# Patient Record
Sex: Female | Born: 1990 | Race: White | Hispanic: No | Marital: Single | Smoking: Former smoker
Health system: Southern US, Community
[De-identification: ages and names within clinical notes are randomized; demographics above are authoritative.]

## PROBLEM LIST (undated history)

## (undated) ENCOUNTER — Inpatient Hospital Stay (HOSPITAL_COMMUNITY): Payer: Self-pay

## (undated) DIAGNOSIS — O4420 Partial placenta previa NOS or without hemorrhage, unspecified trimester: Secondary | ICD-10-CM

## (undated) DIAGNOSIS — A549 Gonococcal infection, unspecified: Secondary | ICD-10-CM

## (undated) DIAGNOSIS — F319 Bipolar disorder, unspecified: Secondary | ICD-10-CM

## (undated) DIAGNOSIS — O98819 Other maternal infectious and parasitic diseases complicating pregnancy, unspecified trimester: Secondary | ICD-10-CM

## (undated) DIAGNOSIS — A749 Chlamydial infection, unspecified: Secondary | ICD-10-CM

## (undated) DIAGNOSIS — O26879 Cervical shortening, unspecified trimester: Secondary | ICD-10-CM

## (undated) DIAGNOSIS — E669 Obesity, unspecified: Secondary | ICD-10-CM

## (undated) DIAGNOSIS — F419 Anxiety disorder, unspecified: Secondary | ICD-10-CM

## (undated) DIAGNOSIS — O139 Gestational [pregnancy-induced] hypertension without significant proteinuria, unspecified trimester: Secondary | ICD-10-CM

## (undated) HISTORY — PX: NO PAST SURGERIES: SHX2092

---

## 2012-04-25 ENCOUNTER — Encounter (HOSPITAL_COMMUNITY): Payer: Self-pay | Admitting: Emergency Medicine

## 2012-04-25 ENCOUNTER — Emergency Department (HOSPITAL_COMMUNITY)
Admission: EM | Admit: 2012-04-25 | Discharge: 2012-04-25 | Disposition: A | Discharge status: Home / Self Care | Payer: Self-pay | Attending: Emergency Medicine | Admitting: Emergency Medicine

## 2012-04-25 DIAGNOSIS — R197 Diarrhea, unspecified: Secondary | ICD-10-CM | POA: Insufficient documentation

## 2012-04-25 DIAGNOSIS — R000 Tachycardia, unspecified: Secondary | ICD-10-CM | POA: Insufficient documentation

## 2012-04-25 DIAGNOSIS — R109 Unspecified abdominal pain: Secondary | ICD-10-CM | POA: Insufficient documentation

## 2012-04-25 DIAGNOSIS — R509 Fever, unspecified: Secondary | ICD-10-CM | POA: Insufficient documentation

## 2012-04-25 DIAGNOSIS — R112 Nausea with vomiting, unspecified: Secondary | ICD-10-CM | POA: Insufficient documentation

## 2012-04-25 DIAGNOSIS — F17200 Nicotine dependence, unspecified, uncomplicated: Secondary | ICD-10-CM | POA: Insufficient documentation

## 2012-04-25 LAB — COMPREHENSIVE METABOLIC PANEL
ALT: 15 U/L (ref 0–35)
AST: 18 U/L (ref 0–37)
Albumin: 3.8 g/dL (ref 3.5–5.2)
CO2: 22 mEq/L (ref 19–32)
Calcium: 8.9 mg/dL (ref 8.4–10.5)
Creatinine, Ser: 0.78 mg/dL (ref 0.50–1.10)
Sodium: 135 mEq/L (ref 135–145)
Total Protein: 7.2 g/dL (ref 6.0–8.3)

## 2012-04-25 LAB — URINALYSIS, ROUTINE W REFLEX MICROSCOPIC
Hgb urine dipstick: NEGATIVE
Nitrite: NEGATIVE
Protein, ur: NEGATIVE mg/dL
Specific Gravity, Urine: 1.013 (ref 1.005–1.030)
Urobilinogen, UA: 0.2 mg/dL (ref 0.0–1.0)

## 2012-04-25 LAB — DIFFERENTIAL
Basophils Relative: 0 % (ref 0–1)
Monocytes Relative: 9 % (ref 3–12)
Neutro Abs: 8.4 10*3/uL — ABNORMAL HIGH (ref 1.7–7.7)
Neutrophils Relative %: 86 % — ABNORMAL HIGH (ref 43–77)

## 2012-04-25 LAB — CBC
Hemoglobin: 13.2 g/dL (ref 12.0–15.0)
MCHC: 32.4 g/dL (ref 30.0–36.0)
Platelets: 164 10*3/uL (ref 150–400)
RBC: 4.36 MIL/uL (ref 3.87–5.11)

## 2012-04-25 LAB — RAPID URINE DRUG SCREEN, HOSP PERFORMED
Amphetamines: NOT DETECTED
Barbiturates: NOT DETECTED
Benzodiazepines: NOT DETECTED
Cocaine: NOT DETECTED
Tetrahydrocannabinol: NOT DETECTED

## 2012-04-25 LAB — PREGNANCY, URINE: Preg Test, Ur: NEGATIVE

## 2012-04-25 LAB — URINE MICROSCOPIC-ADD ON

## 2012-04-25 MED ORDER — OXYCODONE-ACETAMINOPHEN 5-325 MG PO TABS: 1.0000 | ORAL_TABLET | ORAL | Status: AC | PRN

## 2012-04-25 MED ORDER — SODIUM CHLORIDE 0.9 % IV BOLUS (SEPSIS)
1000.0000 mL | Freq: Once | INTRAVENOUS | Status: AC
  Administered 2012-04-25: 1000 mL via INTRAVENOUS

## 2012-04-25 MED ORDER — LOPERAMIDE HCL 2 MG PO CAPS: 2.0000 mg | ORAL_CAPSULE | Freq: Four times a day (QID) | ORAL | Status: AC | PRN

## 2012-04-25 MED ORDER — SODIUM CHLORIDE 0.9 % IV SOLN: 1000.0000 mL | INTRAVENOUS | Status: DC

## 2012-04-25 MED ORDER — DIPHENOXYLATE-ATROPINE 2.5-0.025 MG PO TABS
2.0000 | ORAL_TABLET | ORAL | Status: AC
  Administered 2012-04-25: 2 via ORAL
  Filled 2012-04-25: qty 2

## 2012-04-25 MED ORDER — ACETAMINOPHEN 325 MG PO TABS
650.0000 mg | ORAL_TABLET | Freq: Once | ORAL | Status: AC
  Administered 2012-04-25: 650 mg via ORAL
  Filled 2012-04-25: qty 2

## 2012-04-25 MED ORDER — ONDANSETRON HCL 4 MG/2ML IJ SOLN
4.0000 mg | Freq: Once | INTRAMUSCULAR | Status: AC
  Administered 2012-04-25: 4 mg via INTRAVENOUS
  Filled 2012-04-25: qty 2

## 2012-04-25 MED ORDER — SODIUM CHLORIDE 0.9 % IV BOLUS (SEPSIS): 1000.0000 mL | Freq: Once | INTRAVENOUS | Status: DC

## 2012-04-25 MED ORDER — PROMETHAZINE HCL 25 MG PO TABS: 25.0000 mg | ORAL_TABLET | Freq: Four times a day (QID) | ORAL | Status: DC | PRN

## 2012-04-25 MED ORDER — SODIUM CHLORIDE 0.9 % IV SOLN
1000.0000 mL | Freq: Once | INTRAVENOUS | Status: AC
  Administered 2012-04-25: 1000 mL via INTRAVENOUS

## 2012-04-25 MED ORDER — HYDROMORPHONE HCL PF 1 MG/ML IJ SOLN
1.0000 mg | Freq: Once | INTRAMUSCULAR | Status: AC
  Administered 2012-04-25: 1 mg via INTRAVENOUS
  Filled 2012-04-25: qty 1

## 2012-04-25 NOTE — ED Notes (Signed)
IV started in Right AC, blood drawn from site.

## 2012-04-25 NOTE — ED Notes (Signed)
MD at bedside. 

## 2012-04-25 NOTE — ED Notes (Signed)
Report received-airway intact-no s/s's of distress- 

## 2012-04-25 NOTE — Discharge Instructions (Signed)
Your blood work and urine tests have all been normal and show no sign of a cause of her pain. Please take the medications exactly as prescribed, return to the hospital for severe or worsening symptoms and understand that she may continue to have nausea vomiting and diarrhea for another 24-48 hours. Please drink plenty of fluids in the meantime to prevent dehydration. Please see the attached reading instructions regarding your diagnosis.  RESOURCE GUIDE  Dental Problems  Patients with Medicaid: [facility] [phone] [address]                                           [phone] [address] [employer] Phone:  [phone]                                                  Phone:  [phone]  If unable to pay or uninsured, contact:  [facility] or [facility]. to become qualified for the adult dental clinic.  Chronic Pain Problems Contact [facility]  [phone] Patients need to be referred by their primary care doctor.  Insufficient Money for Medicine Contact [employer]:  call "211" or [facility] [phone].  No Primary Care Doctor Call [facility]  [phone] Other agencies that provide inexpensive medical care    [facility]  [phone]    [facility] Internal Medicine  [phone]    [facility]  [phone]    [facility]  [phone]    [facility]  [phone]    [facility]  [phone]  Psychological Services [facility] Behavioral Health  [phone] [facility] Services  [phone] [facility] Mental Health   [phone] (emergency services [phone])  Substance Abuse Resources [facility]  [phone] [facility] [phone] [facility] [place] [phone] Floydene Flock [phone] Residential & Outpatient Substance Abuse Program  [phone]  Abuse/Neglect [facility] Child Abuse Hotline 712-177-4828 [facility] Child Abuse  Hotline 7123821033 (After Hours)  Emergency Shelter [facility] [phone]  Maternity Homes Room at the [facility] 8703076256 [facility] Services [phone]  MRSA Hotline #:   947-636-0952    [facility] Resources  [facility] [place]     [employer]                          [facility] Dept. [address] [place]                       [address]      [address]  Patrecia Pace  [facility] Phone:  [phone]                                   Phone:  [phone]                 Phone:  [place] Phone:  [place] [phone] (After Hours)

## 2012-04-25 NOTE — ED Notes (Signed)
Pt will call for ride home

## 2012-04-25 NOTE — ED Provider Notes (Signed)
History     CSN: [id number]  Arrival date & time 04/25/12  0448   First MD Initiated Contact with Patient 04/25/12 0540      Chief Complaint  Patient presents with  . Abdominal Pain  . Fever    (Consider location/radiation/quality/duration/timing/severity/associated sxs/prior treatment) HPI Comments: 21 year old female with no significant past medical history who presents with approximately one day of body aches, fevers and nausea. She states that she has had approximately 48 hours of watery diarrhea and nausea which over the last 24 hours has seemed to get worse. Now is associated with a fever as high as 103. Nothing seems to make this better or worse, she has associated sick contacts including family members with similar symptoms. She denies numbness, weakness, rash, swelling. She has chronic sore throat for several years, has not been coughing but does feel mild shortness of breath. She denies vaginal bleeding, dysuria, vaginal discharge.  Patient is a 21 y.o. female presenting with abdominal pain and fever. The history is provided by the patient.  Abdominal Pain The primary symptoms of the illness include abdominal pain and fever.  Fever Primary symptoms of the febrile illness include fever and abdominal pain.    History reviewed. No pertinent past medical history.  History reviewed. No pertinent past surgical history.  No family history on file.  History  Substance Use Topics  . Smoking status: Current Everyday Smoker -- 1.0 packs/day    Types: Cigarettes  . Smokeless tobacco: Not on file  . Alcohol Use: No    OB History    Grav Para Term Preterm Abortions TAB SAB Ect Mult Living                  Review of Systems  Constitutional: Positive for fever.  Gastrointestinal: Positive for abdominal pain.  All other systems reviewed and are negative.    Allergies  Review of patient's allergies indicates no known allergies.  Home Medications   Current Outpatient  Rx  Name Route Sig Dispense Refill  . LOPERAMIDE HCL 2 MG PO CAPS Oral Take 1 capsule (2 mg total) by mouth 4 (four) times daily as needed for diarrhea or loose stools. 12 capsule 0  . OXYCODONE-ACETAMINOPHEN 5-325 MG PO TABS Oral Take 1 tablet by mouth every 4 (four) hours as needed for pain. May take 2 tablets PO q 6 hours for severe pain - Do not take with Tylenol as this tablet already contains tylenol 15 tablet 0  . PROMETHAZINE HCL 25 MG PO TABS Oral Take 1 tablet (25 mg total) by mouth every 6 (six) hours as needed for nausea. 12 tablet 0    BP 112/53  Pulse 111  Temp(Src) 99.6 F (37.6 C) (Oral)  Resp 24  SpO2 97%  LMP 04/02/2012  Physical Exam  Nursing note and vitals reviewed. Constitutional: She appears well-developed and well-nourished. No distress.  HENT:  Head: Normocephalic and atraumatic.  Mouth/Throat: No oropharyngeal exudate.       Mucous membranes mildly dehydrated  Eyes: Conjunctivae and EOM are normal. Pupils are equal, round, and reactive to light. Right eye exhibits no discharge. Left eye exhibits no discharge. No scleral icterus.  Neck: Normal range of motion. Neck supple. No JVD present. No thyromegaly present.  Cardiovascular: Regular rhythm, normal heart sounds and intact distal pulses.  Exam reveals no gallop and no friction rub.   No murmur heard.      Tachycardia to 120  Pulmonary/Chest: Breath sounds normal. No respiratory distress. She  has no wheezes. She has no rales.       Mild tachypnea at 22 breaths per minute on my exam, lung sounds are clear  Abdominal: Soft. Bowel sounds are normal. She exhibits no distension and no mass. There is tenderness ( Non-Focal tenderness in the lower abdomen - including both right and left lower cautery is a suprapubic area.).  Musculoskeletal: Normal range of motion. She exhibits no edema and no tenderness.  Lymphadenopathy:    She has no cervical adenopathy.  Neurological: She is alert. Coordination normal.  Skin:  Skin is warm and dry. No rash noted. No erythema.  Psychiatric: She has a normal mood and affect. Her behavior is normal.    ED Course  Procedures (including critical care time)  Labs Reviewed  DIFFERENTIAL - Abnormal; Notable for the following:    Neutrophils Relative 86 (*)    Neutro Abs 8.4 (*)    Lymphocytes Relative 5 (*)    Lymphs Abs 0.4 (*)    All other components within normal limits  URINALYSIS, ROUTINE W REFLEX MICROSCOPIC - Abnormal; Notable for the following:    Leukocytes, UA SMALL (*)    All other components within normal limits  COMPREHENSIVE METABOLIC PANEL - Abnormal; Notable for the following:    Potassium 3.4 (*)    Glucose, Bld 113 (*)    All other components within normal limits  URINE MICROSCOPIC-ADD ON - Abnormal; Notable for the following:    Squamous Epithelial / LPF FEW (*)    All other components within normal limits  CBC  PREGNANCY, URINE  URINE RAPID DRUG SCREEN (HOSP PERFORMED)   No results found.   1. Nausea vomiting and diarrhea   2. Abdominal pain       MDM  The patient has a soft abdomen but tenderness in her infraumbilical area both right and left lower cautery and see and suprapubic. This is nonfocal, there is no guarding or masses and she is non-peritoneal. We'll proceed with laboratory workup to rule out renal injury, liver injury, blood counts, urinalysis. IV fluids, IV antiemetics, IV pain medications.   Patient has been reevaluated, has defervesced fever, soft abdomen with minimal periumbilical tenderness, nausea has totally gone away and her pulse has improved to approximately 105. 2 L of IV fluids, acetaminophen, intravenous hydromorphone, Lomotil, intravenous Zofran have given with very good improvement in symptoms. Patient has been informed of her results and agrees to followup as indicated for ongoing symptoms or worsening symptoms.  Discharge Prescriptions include:  #1 Percocet  #2 Phenergan  #3 Imodium  Vida Roller, MD 04/25/12 [phone]

## 2012-04-25 NOTE — ED Notes (Signed)
Pt presents with c/o abdominal pain and vomiting. Sts she has been experiencing diarrhea x 2 days. Patient appears anxious. Face flushed, hot to touch. Patient congested and sts that she feels slightly numb from her back to her knees. No recent trauma or injury. Patient short of breath, O2 100% RA, dyspnea at rest, appears anxious. Pt sts pain in abdominal, pain upon palpation of lower quadrants. Pain in abdomin radiates to lower back and flank area.

## 2012-04-25 NOTE — ED Notes (Signed)
Pt alert, nad, c/o fever, body aches,onset last pm, resp even unlabored, skin pwd, recent ill exposures at work

## 2012-11-23 ENCOUNTER — Inpatient Hospital Stay (HOSPITAL_COMMUNITY): Payer: Self-pay

## 2012-11-23 ENCOUNTER — Encounter (HOSPITAL_COMMUNITY): Payer: Self-pay

## 2012-11-23 ENCOUNTER — Inpatient Hospital Stay (HOSPITAL_COMMUNITY)
Admission: AD | Admit: 2012-11-23 | Discharge: 2012-11-23 | Disposition: A | Discharge status: Home / Self Care | Payer: Self-pay | Source: Ambulatory Visit | Attending: Obstetrics & Gynecology | Admitting: Obstetrics & Gynecology

## 2012-11-23 DIAGNOSIS — O99891 Other specified diseases and conditions complicating pregnancy: Secondary | ICD-10-CM | POA: Insufficient documentation

## 2012-11-23 DIAGNOSIS — R109 Unspecified abdominal pain: Secondary | ICD-10-CM

## 2012-11-23 DIAGNOSIS — O34599 Maternal care for other abnormalities of gravid uterus, unspecified trimester: Secondary | ICD-10-CM | POA: Insufficient documentation

## 2012-11-23 DIAGNOSIS — N83209 Unspecified ovarian cyst, unspecified side: Secondary | ICD-10-CM | POA: Insufficient documentation

## 2012-11-23 DIAGNOSIS — O26899 Other specified pregnancy related conditions, unspecified trimester: Secondary | ICD-10-CM

## 2012-11-23 DIAGNOSIS — R21 Rash and other nonspecific skin eruption: Secondary | ICD-10-CM | POA: Insufficient documentation

## 2012-11-23 LAB — HCG, QUANTITATIVE, PREGNANCY: hCG, Beta Chain, Quant, S: 79153 m[IU]/mL — ABNORMAL HIGH (ref <5)

## 2012-11-23 NOTE — MAU Note (Signed)
Was sent from pregnancy center because unable to get fht.

## 2012-11-23 NOTE — MAU Note (Signed)
Patient denies abdominal pain, denies vaginal bleeding or change in vaginal discharge, patient states that she needs proof of pregnancy to obtain  Pregnancy Medicaid, she is unsure where she plans to obtain prenatal care.

## 2012-11-23 NOTE — MAU Provider Note (Signed)
History     CSN: [id number]  Arrival date and time: 11/23/12 1626   None     No chief complaint on file.  HPI  Pt is [redacted]w[redacted]d pregnant and [facility]  [facility] was unable to identify IUP and were concerned about ectopic.  Pt denies spotting, bleeding, UTI symptoms, constipation, diarrhea, vaginal discharge.  She has mild occ abd pain. Pt is here for pregnancy confirmation.  No past medical history on file.  No past surgical history on file.  Family History  Problem Relation Age of Onset  . Hypertension Father   . Diabetes Father     History  Substance Use Topics  . Smoking status: Former Games developer  . Smokeless tobacco: Not on file  . Alcohol Use: Yes     Comment: none since finding out pregnant    Allergies: No Known Allergies  Prescriptions prior to admission  Medication Sig Dispense Refill  . Emollient (COCOA BUTTER EX) Apply 1 application topically daily.      . Prenatal Vit-Fe Fumarate-FA (PRENATAL MULTIVITAMIN) TABS Take 1 tablet by mouth daily.        Review of Systems  Constitutional: Positive for malaise/fatigue. Negative for fever and chills.  Gastrointestinal: Positive for abdominal pain. Negative for heartburn, nausea, vomiting, diarrhea and constipation.       No abdominal pain at this time  Genitourinary: Negative for dysuria, urgency and frequency.  Skin: Positive for rash.       Pt has diffuse rash over arms and hands that causes itching.  She is using Zest soap  Neurological: Negative for headaches.   Physical Exam   Blood pressure 128/76, pulse 94, temperature 98.1 F (36.7 C), temperature source Oral, resp. rate 16, last menstrual period 09/12/2012.  Physical Exam  Vitals reviewed. Constitutional: She is oriented to person, place, and time. She appears well-developed and well-nourished.  HENT:  Head: Normocephalic.  Eyes: Pupils are equal, round, and reactive  to light.  Neck: Normal range of motion. Neck supple.  Cardiovascular: Normal rate.   Respiratory: Effort normal.  GI: Soft.  Musculoskeletal: Normal range of motion.  Neurological: She is alert and oriented to person, place, and time.  Skin: Skin is warm and dry.  Psychiatric: She has a normal mood and affect.    MAU Course  Procedures RADIOLOGY REPORT*  Clinical Data: Assess fetal viability. Positive pregnancy test.  OBSTETRIC <14 WK ULTRASOUND  Technique: Transabdominal ultrasound was performed for evaluation  of the gestation as well as the maternal uterus and adnexal  regions.  Comparison: None.  Intrauterine gestational sac: Visualized/normal in shape.  Yolk sac: Not visualized.  Embryo: Present  Cardiac Activity: Present  Heart Rate: 172 bpm  MSD: mm w d  CRL: 25 mm 9 w 2 d [place] EDC: 06/26/2013.  Maternal uterus/Adnexae:  Left ovary measures 59 mm x 55 mm x 52 mm with a large simple  ovarian cyst that displaces the ovarian parenchyma to the  periphery. No mural nodule. Normal increased through transmission  compatible with a benign cyst.  IMPRESSION:  1. Single viable intrauterine pregnancy with estimated gestational  age [redacted] weeks 2 days. Fetal heart tones of 172 beats per minute.  2. Simple left ovarian cyst, measuring 5.9 cm long axis. Normal  color flow to the peripherally displaced ovarian tissue. In the  setting of pregnancy, the cyst can be followed up on subsequent  ultrasound examinations. If the  cyst persists, annual follow-up is  appropriate.This recommendation follows the consensus statement:  Management of Asymptomatic Ovarian and Other Adnexal Cysts Imaged  at [place]: Society of Radiologists in Ultrasound Consensus Conference  Statement. Radiology 2010; [phone].  Original Report Authenticated By: Andreas Newport, M.D.       Results for orders placed during the hospital encounter of 11/23/12 (from the past 24 hour(s))  HCG, QUANTITATIVE, PREGNANCY      Status: Abnormal   Collection Time   11/23/12  5:20 PM      Component Value Range   hCG, Beta Chain, [place] Longest [address] (*) <5 mIU/mL  exam deferred since pt asymptomatic  Assessment and Plan  Viable IUP [redacted]w[redacted]d Left simple ovarian cyst Proceed with OB care- information for Medicaid application Pt to return if any increase in abdominal pain or spotting/bleeding Rash- recommend Benadryl and change to Cetaphil cleanser Kalin Amrhein 11/23/2012, 6:43 PM

## 2012-11-24 ENCOUNTER — Encounter (HOSPITAL_COMMUNITY): Payer: Self-pay | Admitting: Emergency Medicine

## 2012-12-13 NOTE — L&D Delivery Note (Signed)
Operative Delivery Note At 6:54 PM a viable female was delivered via Vaginal, Vacuum Investment banker, operational).  Presentation: vertex; Position: Right,, Occiput,, Anterior; Station: +2.  The vacuum was applied and pulled with 3 contractions.  There were 2 pop offs.  At this time the vertex was crowning and the patient delivered without the vacuum.    Verbal consent: obtained from patient.  Risks and benefits discussed in detail.  Risks include, but are not limited to the risks of anesthesia, bleeding, infection, damage to maternal tissues, fetal cephalhematoma.  There is also the risk of inability to effect vaginal delivery of the head, or shoulder dystocia that cannot be resolved by established maneuvers, leading to the need for emergency cesarean section.  APGAR: 8, 9; weight 9 lb 11.7 oz (4415 g).   Placenta status: Intact, Spontaneous.   Cord: 3 vessels with the following complications: None.  Cord pH: pending  Mild post partum hemorrhage.  Uterus cleared of clot x1.  Cytotec given per rectum. No bleeding after Cytotec.  Anesthesia: Epidural  Instruments: Kiwi vacuum Episiotomy: None Lacerations: 2nd degree Suture Repair: 3.0 vicryl rapide Est. Blood Loss (mL):   Mom to postpartum.  Baby to nursery-stable.  Audrey Collins H. 06/15/2013, 7:45 PM

## 2013-01-02 ENCOUNTER — Inpatient Hospital Stay (HOSPITAL_COMMUNITY)
Admission: AD | Admit: 2013-01-02 | Discharge: 2013-01-03 | Disposition: A | Discharge status: Home / Self Care | Payer: Medicaid Other | Source: Ambulatory Visit | Attending: Obstetrics and Gynecology | Admitting: Obstetrics and Gynecology

## 2013-01-02 ENCOUNTER — Inpatient Hospital Stay (HOSPITAL_COMMUNITY): Payer: Medicaid Other

## 2013-01-02 ENCOUNTER — Encounter (HOSPITAL_COMMUNITY): Payer: Self-pay | Admitting: *Deleted

## 2013-01-02 DIAGNOSIS — O3430 Maternal care for cervical incompetence, unspecified trimester: Secondary | ICD-10-CM

## 2013-01-02 DIAGNOSIS — O26879 Cervical shortening, unspecified trimester: Secondary | ICD-10-CM | POA: Insufficient documentation

## 2013-01-02 DIAGNOSIS — O99891 Other specified diseases and conditions complicating pregnancy: Secondary | ICD-10-CM | POA: Insufficient documentation

## 2013-01-02 DIAGNOSIS — R1031 Right lower quadrant pain: Secondary | ICD-10-CM | POA: Insufficient documentation

## 2013-01-02 LAB — WET PREP, GENITAL
Trich, Wet Prep: NONE SEEN
Yeast Wet Prep HPF POC: NONE SEEN

## 2013-01-02 LAB — URINALYSIS, ROUTINE W REFLEX MICROSCOPIC
Glucose, UA: NEGATIVE mg/dL
Hgb urine dipstick: NEGATIVE
Ketones, ur: NEGATIVE mg/dL
Protein, ur: NEGATIVE mg/dL
pH: 5.5 (ref 5.0–8.0)

## 2013-01-02 LAB — URINE MICROSCOPIC-ADD ON

## 2013-01-02 NOTE — MAU Note (Signed)
Pt reports for last 2 weeks has been having rt lower abd pain, worse with sitting and walking. Denies bleeding.

## 2013-01-02 NOTE — Progress Notes (Signed)
Pt states she had one episode of diarrhea this morning

## 2013-01-02 NOTE — MAU Note (Signed)
Pt states she started having pain about 2 wks ago. Pt states she feels pain more when she is standing pain is worse in the morning when she gets and when she walks around and when she is sitting. P t states wlhen she is laying downing she feels better

## 2013-01-02 NOTE — Progress Notes (Signed)
Pt states she feels like she has more discharge.

## 2013-01-02 NOTE — MAU Provider Note (Signed)
History     CSN: [id number]  Arrival date and time: 01/02/13 2121   First Provider Initiated Contact with Patient 01/02/13 2240      Chief Complaint  Patient presents with  . Abdominal Pain   HPI Audrey Collins 22 y.o. [redacted]w[redacted]d  Comes to MAU with RLQ pain that she has had for 2 weeks.  Does not radiate into groin.  Is worse when up walking.  Eases when lying down.  Has not taken any medication for pain.  Had vomiting this morning but then was able to eat and drink well today.  OB History    Grav Para Term Preterm Abortions TAB SAB Ect Mult Living   1               History reviewed. No pertinent past medical history.  History reviewed. No pertinent past surgical history.  Family History  Problem Relation Age of Onset  . Hypertension Father   . Thyroid disease Mother   . Diabetes Paternal Grandmother     History  Substance Use Topics  . Smoking status: Former Games developer  . Smokeless tobacco: Not on file  . Alcohol Use: Yes     Comment: none since finding out pregnant    Allergies: No Known Allergies  Prescriptions prior to admission  Medication Sig Dispense Refill  . Emollient (COCOA BUTTER EX) Apply 1 application topically daily.      . Prenatal Vit-Fe Fumarate-FA (PRENATAL MULTIVITAMIN) TABS Take 1 tablet by mouth daily.      . promethazine (PHENERGAN) 25 MG tablet Take 1 tablet (25 mg total) by mouth every 6 (six) hours as needed for nausea.  12 tablet  0    Review of Systems  Constitutional: Negative for fever.  Gastrointestinal: Positive for nausea, vomiting, abdominal pain and diarrhea. Negative for constipation.  Genitourinary:       More vaginal discharge. No vaginal bleeding. No dysuria.   Physical Exam   Blood pressure 125/70, pulse 103, temperature 98.1 F (36.7 C), temperature source Oral, resp. rate 20, height 5\' 9"  (1.753 m), weight 106.595 kg (235 lb), last menstrual period 09/12/2012, SpO2 100.00%.  Physical Exam  Nursing note and vitals  reviewed. Constitutional: She is oriented to person, place, and time. She appears well-developed and well-nourished.  HENT:  Head: Normocephalic.  Eyes: EOM are normal.  Neck: Neck supple.  GI: Soft. There is tenderness. There is no rebound and no guarding.       Mild tenderness in RLQ near midline with palpation  Genitourinary:       Speculum exam: Vagina - Small amount of creamy discharge, no odor Cervix - No contact bleeding, hard to visualize entirely Bimanual exam: Cervix int os closed.  Cervix feels like multiparous cervix but client denies any previous pregnancies Uterus non tender, 16 week size Adnexa diffuse tenderness on right, non tender on left, GC/Chlam, wet prep done Chaperone present for exam.  Musculoskeletal: Normal range of motion.  Neurological: She is alert and oriented to person, place, and time.  Skin: Skin is warm and dry.  Psychiatric: She has a normal mood and affect.    MAU Course  Procedures Results for orders placed during the hospital encounter of 01/02/13 (from the past 24 hour(s))  URINALYSIS, ROUTINE W REFLEX MICROSCOPIC     Status: Abnormal   Collection Time   01/02/13  9:32 PM      Component Value Range   Color, Urine YELLOW  YELLOW   APPearance CLEAR  CLEAR   Specific Gravity, Urine >1.030 (*) 1.005 - 1.030   pH 5.5  5.0 - 8.0   Glucose, UA NEGATIVE  NEGATIVE mg/dL   Hgb urine dipstick NEGATIVE  NEGATIVE   Bilirubin Urine NEGATIVE  NEGATIVE   Ketones, ur NEGATIVE  NEGATIVE mg/dL   Protein, ur NEGATIVE  NEGATIVE mg/dL   Urobilinogen, UA 0.2  0.0 - 1.0 mg/dL   Nitrite NEGATIVE  NEGATIVE   Leukocytes, UA SMALL (*) NEGATIVE  URINE MICROSCOPIC-ADD ON     Status: Normal   Collection Time   01/02/13  9:32 PM      Component Value Range   Squamous Epithelial / LPF RARE  RARE   WBC, UA 0-2  <3 WBC/hpf   RBC / HPF 0-2  <3 RBC/hpf   Bacteria, UA RARE  RARE  WET PREP, GENITAL     Status: Abnormal   Collection Time   01/02/13 10:54 PM       Component Value Range   Yeast Wet Prep HPF POC NONE SEEN  NONE SEEN   Trich, Wet Prep NONE SEEN  NONE SEEN   Clue Cells Wet Prep HPF POC FEW (*) NONE SEEN   WBC, Wet Prep HPF POC FEW (*) NONE SEEN    MDM Given cervix being soft and external os open, will do [place] for cervical length.  Clinical Data: Right lower quadrant pain. Evaluate cervical  length.  LIMITED OBSTETRIC ULTRASOUND; TRANSVAGINAL OBSTETRIC ULTRASOUND  Number of Fetuses: 1  Heart Rate: 147 bpm  Movement: Yes  Presentation: cephalic  Placental Location: Anterior  Previa: Marginal  Amniotic Fluid (Subjective): Normal  BPD: 3.2 cm 16 w 0 d EDC: 06/19/2013  MATERNAL FINDINGS:  Cervix: Closed. Cervical length measures 2.5 cm by transvaginal  ultrasound. The inferior margin of the placenta abuts the internal  loss of the cervix.  Uterus/Adnexae: Left ovary contains a cyst that measures up to 3.5  cm. Normal appearance of the right ovary.  IMPRESSION:  The cervix is closed and the cervix measures 2.5 cm in length.  Marginal placenta previa.  Gestational age by ultrasound is 16 weeks and 0 days.  Left ovarian cyst.  Recommend followup with non-emergent complete OB 14+ wk US  examination for fetal biometric evaluation and anatomic survey if  not already performed.   1610 Consult with Dr. Jolayne Panther  Assessment and Plan  16 weeks with abdominal pain and shortened cervix  Plan At high risk for incompetent cervix Message sent to clinic for an appointment with MD ASAP Pelvic rest Drink at least 8 8-oz glasses of water every day. Take Tylenol 325 mg 2 tablets by mouth every 4 hours if needed for pain.   Delano Frate 01/02/2013, 11:00 PM

## 2013-01-03 NOTE — MAU Provider Note (Signed)
Attestation of Attending Supervision of Advanced Practitioner (CNM/NP): Evaluation and management procedures were performed by the Advanced Practitioner under my supervision and collaboration.  I have reviewed the Advanced Practitioner's note and chart, and I agree with the management and plan.  Marticia Reifschneider 01/03/2013 9:34 AM

## 2013-01-04 ENCOUNTER — Encounter: Payer: Self-pay | Admitting: Obstetrics and Gynecology

## 2013-01-04 ENCOUNTER — Encounter: Payer: Self-pay | Admitting: Advanced Practice Midwife

## 2013-01-04 ENCOUNTER — Ambulatory Visit (INDEPENDENT_AMBULATORY_CARE_PROVIDER_SITE_OTHER): Payer: Self-pay | Admitting: Advanced Practice Midwife

## 2013-01-04 VITALS — BP 130/80 | Temp 97.5°F | Wt 232.2 lb

## 2013-01-04 DIAGNOSIS — O98319 Other infections with a predominantly sexual mode of transmission complicating pregnancy, unspecified trimester: Secondary | ICD-10-CM

## 2013-01-04 DIAGNOSIS — O4420 Partial placenta previa NOS or without hemorrhage, unspecified trimester: Secondary | ICD-10-CM

## 2013-01-04 DIAGNOSIS — A749 Chlamydial infection, unspecified: Secondary | ICD-10-CM | POA: Insufficient documentation

## 2013-01-04 DIAGNOSIS — A568 Sexually transmitted chlamydial infection of other sites: Secondary | ICD-10-CM

## 2013-01-04 DIAGNOSIS — Z23 Encounter for immunization: Secondary | ICD-10-CM

## 2013-01-04 DIAGNOSIS — O98819 Other maternal infectious and parasitic diseases complicating pregnancy, unspecified trimester: Secondary | ICD-10-CM

## 2013-01-04 DIAGNOSIS — O4410 Placenta previa with hemorrhage, unspecified trimester: Secondary | ICD-10-CM

## 2013-01-04 DIAGNOSIS — O09899 Supervision of other high risk pregnancies, unspecified trimester: Secondary | ICD-10-CM

## 2013-01-04 DIAGNOSIS — O26879 Cervical shortening, unspecified trimester: Secondary | ICD-10-CM

## 2013-01-04 DIAGNOSIS — Z3490 Encounter for supervision of normal pregnancy, unspecified, unspecified trimester: Secondary | ICD-10-CM | POA: Insufficient documentation

## 2013-01-04 LAB — POCT URINALYSIS DIP (DEVICE)
Bilirubin Urine: NEGATIVE
Glucose, UA: NEGATIVE mg/dL
Hgb urine dipstick: NEGATIVE
Nitrite: NEGATIVE
Specific Gravity, Urine: >=1.03 (ref 1.005–1.030)
Urobilinogen, UA: 0.2 mg/dL (ref 0.0–1.0)
pH: 5.5 (ref 5.0–8.0)

## 2013-01-04 LAB — GC/CHLAMYDIA PROBE AMP: CT Probe RNA: POSITIVE — AB

## 2013-01-04 MED ORDER — INFLUENZA VIRUS VACC SPLIT PF IM SUSP
0.5000 mL | INTRAMUSCULAR | Status: AC
  Administered 2013-01-04: 0.5 mL via INTRAMUSCULAR

## 2013-01-04 MED ORDER — PROGESTERONE MICRONIZED 200 MG PO CAPS: 200.0000 mg | ORAL_CAPSULE | Freq: Every day | ORAL | Status: DC

## 2013-01-04 MED ORDER — AZITHROMYCIN 1 G PO PACK
1.0000 g | PACK | Freq: Once | ORAL | Status: AC
  Administered 2013-01-04: 1 g via ORAL

## 2013-01-04 NOTE — Progress Notes (Signed)
Patient states she has pain in both legs but mainly just her right leg as well as some pain in her LRQ. p=102

## 2013-01-04 NOTE — Progress Notes (Signed)
Nutrition note: 1st visit consult Pt has h/o obesity. Pt has gained 22.2# @ [redacted]w[redacted]d, which is > expected. Pt reports eating 3 meals & 2 snacks/d. Pt reports drinking 2-3c of sweet tea/d as well as water & milk daily. Pt is taking PNV. Pt reports N&V as well as heartburn. Pt reports she is not physically active. Pt received verbal & written education on general nutrition during pregnancy. Disc tips to slow wt gain & encouraged pt to decrease sweet tea. Disc tips to decrease heartburn and N&V. Disc wt gain goals of 11-20# or 0.5#/wk. Pt agrees to continue PNV & decrease amount of sweet tea. Pt does not receive WIC but plans to apply. Pt plans to BF. F/u if referred Blondell Reveal, MS, RD, LDN

## 2013-01-04 NOTE — Progress Notes (Signed)
Subjective:    Audrey Collins is being seen today for her first obstetrical visit.  This is not a planned pregnancy. She is at [redacted]w[redacted]d gestation by LMP, verified by 16 week [place]. Her obstetrical history is significant for obesity and short cervix 2.5 cm at 16 weeks, pos CT. Patient does intend to breast feed. Pregnancy history fully reviewed. Seen in [facility] 01/02/13 for RLQ pain. Some improvement. No GI complaints, fever, chills, urinary complaints, VB, vaginal discharge.    Menstrual History: OB History    Grav Para Term Preterm Abortions TAB SAB Ect Mult Living   1               Patient's last menstrual period was 09/12/2012.    The following portions of the patient's history were reviewed and updated as appropriate: allergies, current medications, past family history, past medical history, past social history, past surgical history and problem list.  Review of Systems Pertinent items are noted in HPI.    Objective:    BP 130/80  Temp 97.5 F (36.4 C)  Wt 105.325 kg (232 lb 3.2 oz)  LMP 09/12/2012 General appearance: alert, cooperative, appears stated age, no distress and mildly obese Neck: no adenopathy and thyroid not enlarged, symmetric, no tenderness/mass/nodules Lungs: clear to auscultation bilaterally Heart: regular rate and rhythm, S1, S2 normal, no murmur, click, rub or gallop Abdomen: soft, non-tender; bowel sounds normal; no masses,  no organomegaly and gravid, S=D. Pos FHTs Neurologic: Alert and oriented X 3, normal strength and tone. Normal symmetric reflexes. Normal coordination and gait    Assessment:    Pregnancy at 16 and 2/7 weeks  1. Short cervix affecting pregnancy  US OB Transvaginal, progesterone (PROMETRIUM) 200 MG capsule, DISCONTINUED: progesterone (PROMETRIUM) 200 MG capsule  2. Supervision of other high-risk pregnancy  Prenatal (OB Panel), HIV Antibody ( Reflex), Glucose Tolerance, 1 HR (50g), US OB Detail + 14 Wk, AFP/Quad Scr, Culture, OB Urine  3.  Marginal placenta previa    4. Need for prophylactic vaccination and inoculation against influenza  influenza  inactive virus vaccine (FLUZONE/FLUARIX) injection 0.5 mL  5. Chlamydia trachomatis infection in pregnancy  azithromycin (ZITHROMAX) powder 1 g  6. Chlamydia infection complicating pregnancy       Plan:    Initial labs drawn. Prenatal vitamins. Problem list reviewed and updated. AFP3 discussed: ordered. Role of ultrasound in pregnancy discussed; fetal survey: Anatomy scan and transvaginal US for CL ordered in 2 weeks. Amniocentesis discussed: not indicated. Start vaginal Prometrium per Dr. Penne Lash (consulted w/ MFM). Pelvic rest until marginal previa resolved and cervix stable. Previa precautions.  Follow up in 4 weeks. 75% of 30 min visit spent on counseling and coordination of care.   [place], [place] 01/04/2013 10:57 PM

## 2013-01-04 NOTE — Progress Notes (Signed)
U/S x2 scheduled with MFM on 01/18/13 at 3 pm.

## 2013-01-04 NOTE — Patient Instructions (Signed)
Chlamydia, Female Chlamydia is an infection caused by bacteria. It is spread through sexual contact. Chlamydia can be in different areas of the body. These areas include the cervix, urethra, throat, or rectum. If you are infected, you must finish all treatments and follow up with a caregiver.  CAUSES  Chlamydia is a sexually transmitted disease. It is passed from an infected partner during intimate contact. This contact could be with the genitals, mouth, or rectal area. Infections can also be passed from mothers to babies during birth. SYMPTOMS  There may not be any symptoms. This is often the case early in the infection. Symptoms you may notice include:  Mild pain and discomfort when urinating.  Inflammation of the rectum.  Vaginal discharge.  Painful intercourse.  Abdominal pain.  Bleeding between menstrual periods. DIAGNOSIS  To diagnose this infection, your caregiver will do a pelvic exam. Cultures will be taken of the vagina, cervix, urine, and possibly the rectum to see if the infection is chlamydia. TREATMENT You will be given antibiotic medicines. Any sexual partners should also be treated, even if they do not show symptoms. Take the medicine for the prescribed length of time. If you are pregnant, do not take tetracycline-type antibiotics. HOME CARE INSTRUCTIONS   Take your antibiotics as directed. Finish them even if you start to feel better.  Only take over-the-counter or prescription medicines for pain, discomfort, or fever as directed by your caregiver.  Inform any sexual partners about the infection. They should be treated also.  Do not have sexual contact until your caregiver tells you it is okay.  Get plenty of rest.  Eat a well-balanced diet, and drink enough fluids to keep your urine clear or pale yellow.  Keep all follow-up appointments and tests. SEEK IMMEDIATE MEDICAL CARE IF:   Your symptoms return.  You have a fever. MAKE SURE YOU:   Understand these  instructions.  Will watch your condition.  Will get help right away if you are not doing well or get worse. Document Released: 09/08/2005 Document Revised: 02/21/2012 Document Reviewed: 07/17/2008 [facility] Patient Information 2013 [place], [place].  Placenta Previa Placenta previa is a condition in which the placenta has grown low in the womb (uterus). This is a condition in which the organ which connects the fetus to the mother's uterus (placenta) is low in the opening in the uterus (cervix). It can partially or completely cover the cervix. The cause of this is unknown. It is more common with multiple births or twins. SYMPTOMS  The main symptom or sign of placenta previa is vaginal bleeding. The bleeding can be mild to very heavy. This condition can be very serious for the mother and baby. Often there are no symptoms with placenta previa. Sometimes if the location of the placenta is very low it will become partially detached and cause bleeding. This may be simply a marginal sinus separation of the placenta. This is a separation of the vessels from the wall of the uterus. This may cause no further problems other than mild anxiety. There is an increase risk of intrauterine growth restriction (IUGR) with placenta previa because of the abnormal placement of the placenta. DIAGNOSIS  The diagnosis is usually made by ultrasound exam of the uterus. There may be a careful vaginal exam to see the cervix. The patient will be prepared for a Cesarean section immediately if necessary. TREATMENT  Treatment for placenta previa is usually bed rest in the hospital or at home. You may be given medication to stop  contractions. Contractions can increase bleeding. Your doctor may take fluid from the baby's sac (amniocentesis) to see if the baby's lungs are mature enough for a C-section. A blood transfusion may be necessary if you have a low blood count. No further treatment may be needed when placenta previa is present in  small degrees. Early placenta previa may resolve on it's own. The placenta moves higher in the birth canal as pregnancy progresses. In this case the placenta no longer is an obstruction to birth. The position of the placenta may need to be reconfirmed during pregnancy. This can be done with an ultrasound exam of the belly(abdomen). Call your caregiver immediately if blood loss is severe. Immediate fluid or blood replacement may be necessary. With complete placenta previa, the only way to safely deliver the baby is by Cesarean section. HOME CARE INSTRUCTIONS   Follow your caregiver's advice about bed rest.  Take any iron pills or other medications your doctor gives to you.  No bending or lifting.  Do not have sexual intercourse.  Do not put anything in your vagina (tampons or vaginal creams). If you are bleeding, use sanitary pads.  Keep your doctors appointments as scheduled. Not keeping the appointment could result in a chronic or permanent injury, pain, disability and injury or death to you or your unborn baby. If there is any problem keeping the appointment, you must call back to this facility for assistance. SEEK IMMEDIATE MEDICAL CARE IF:   You have increased bleeding.  You have fainting episodes or feel lightheaded.  You develop abdominal pain.  You can no longer feel normal fetal or baby movements.  You develop uterine contractions. Document Released: 11/29/2005 Document Revised: 02/21/2012 Document Reviewed: 07/12/2008 [facility] Patient Information 2013 [place], [place].

## 2013-01-05 ENCOUNTER — Telehealth: Payer: Self-pay | Admitting: *Deleted

## 2013-01-05 ENCOUNTER — Encounter: Payer: Self-pay | Admitting: Advanced Practice Midwife

## 2013-01-05 DIAGNOSIS — O99810 Abnormal glucose complicating pregnancy: Secondary | ICD-10-CM | POA: Insufficient documentation

## 2013-01-05 LAB — OBSTETRIC PANEL
Basophils Relative: 0 % (ref 0–1)
Hemoglobin: 11.6 g/dL — ABNORMAL LOW (ref 12.0–15.0)
Hepatitis B Surface Ag: NEGATIVE
Lymphs Abs: 1.2 10*3/uL (ref 0.7–4.0)
MCHC: 33.5 g/dL (ref 30.0–36.0)
Monocytes Relative: 7 % (ref 3–12)
Neutro Abs: 6.2 10*3/uL (ref 1.7–7.7)
Neutrophils Relative %: 76 % (ref 43–77)
Platelets: 221 10*3/uL (ref 150–400)
RBC: 3.83 MIL/uL — ABNORMAL LOW (ref 3.87–5.11)
Rh Type: POSITIVE

## 2013-01-05 LAB — GLUCOSE TOLERANCE, 1 HOUR (50G) W/O FASTING: Glucose, 1 Hour GTT: 145 mg/dL — ABNORMAL HIGH (ref 70–140)

## 2013-01-05 NOTE — Telephone Encounter (Signed)
Message copied by Mannie Stabile on Fri Jan 05, 2013 12:20 PM ------      Message from: [place], [place]      Created: Fri Jan 05, 2013  3:41 AM       1 hour GTT 145. Needs 3 hour GTT.

## 2013-01-05 NOTE — Telephone Encounter (Signed)
Pt informed. She will come in on Tuesday 01/09/13 for her 3hour gtt. All instructions given.

## 2013-01-06 LAB — CULTURE, OB URINE: Colony Count: >=100000

## 2013-01-09 ENCOUNTER — Encounter (HOSPITAL_COMMUNITY): Payer: Self-pay | Admitting: Advanced Practice Midwife

## 2013-01-09 ENCOUNTER — Other Ambulatory Visit: Payer: Self-pay

## 2013-01-09 DIAGNOSIS — R7309 Other abnormal glucose: Secondary | ICD-10-CM

## 2013-01-10 ENCOUNTER — Encounter: Payer: Self-pay | Admitting: *Deleted

## 2013-01-10 ENCOUNTER — Telehealth: Payer: Self-pay | Admitting: *Deleted

## 2013-01-10 LAB — GLUCOSE TOLERANCE, 3 HOURS: Glucose, GTT - 3 Hour: 39 mg/dL — CL (ref 70–144)

## 2013-01-10 NOTE — Telephone Encounter (Signed)
Received Vivianne's quad screen results-screen positive for down's- already had detailed ultrasound in MFM ordered for 01/18/13- called them and added genetic counseling for positive quad screen on same day. Called patient and explained results and gave her appointments. Patient voices understanding.

## 2013-01-12 ENCOUNTER — Encounter: Payer: Self-pay | Admitting: Advanced Practice Midwife

## 2013-01-12 DIAGNOSIS — O280 Abnormal hematological finding on antenatal screening of mother: Secondary | ICD-10-CM | POA: Insufficient documentation

## 2013-01-18 ENCOUNTER — Encounter (HOSPITAL_COMMUNITY): Payer: Self-pay

## 2013-01-18 ENCOUNTER — Ambulatory Visit (HOSPITAL_COMMUNITY)
Admission: RE | Admit: 2013-01-18 | Discharge: 2013-01-18 | Disposition: A | Discharge status: Home / Self Care | Payer: Self-pay | Source: Ambulatory Visit | Attending: Advanced Practice Midwife | Admitting: Advanced Practice Midwife

## 2013-01-18 VITALS — BP 130/82 | HR 89 | Wt 238.0 lb

## 2013-01-18 DIAGNOSIS — O09899 Supervision of other high risk pregnancies, unspecified trimester: Secondary | ICD-10-CM

## 2013-01-18 DIAGNOSIS — O99810 Abnormal glucose complicating pregnancy: Secondary | ICD-10-CM

## 2013-01-18 DIAGNOSIS — O26879 Cervical shortening, unspecified trimester: Secondary | ICD-10-CM

## 2013-01-18 DIAGNOSIS — O4420 Partial placenta previa NOS or without hemorrhage, unspecified trimester: Secondary | ICD-10-CM

## 2013-01-18 DIAGNOSIS — O280 Abnormal hematological finding on antenatal screening of mother: Secondary | ICD-10-CM

## 2013-01-18 DIAGNOSIS — O98819 Other maternal infectious and parasitic diseases complicating pregnancy, unspecified trimester: Secondary | ICD-10-CM

## 2013-01-18 NOTE — Progress Notes (Signed)
Genetic Counseling  High-Risk Gestation Note  Appointment Date:  01/18/2013 Referred By: Dorathy Kinsman, CNM Date of Birth:  26-Feb-1991    Pregnancy History: G1P0 Estimated Date of Delivery: 06/26/13 Attending: Particia Nearing, MD   Audrey Collins and her partner, Mr. Marvia Pickles, were seen for genetic counseling because of an increased risk for fetal Down syndrome based on Quad screen performed through [facility].  They were counseled regarding the Quad screen result and the associated 1 in 105 risk for fetal Down syndrome.  We reviewed chromosomes, nondisjunction, and the common features and variable prognosis of Down syndrome.  In addition, we reviewed the screen adjusted reduction in risks for trisomy 18 (1 in 4,495 to less than 1 in 10,000) and ONTDs.  We also discussed other explanations for a screen positive result including: a gestational dating error, differences in maternal metabolism, and normal variation. They understand that this screening is not diagnostic for Down syndrome but provides a risk assessment.  We reviewed other available screening options including noninvasive prenatal testing (NIPT) and detailed ultrasound.  Specifically, we discussed that NIPT analyzes cell free fetal DNA found in the maternal circulation. This test is not diagnostic for chromosome conditions, but can provide information regarding the presence or absence of extra fetal DNA for chromosomes 13, 18, and 21. Thus, it would not identify or rule out all genetic conditions. The reported detection rate is greater than 99% for Trisomy 21, greater than 98% for Trisomy 18, and is approximately 80% (8 out of 10) for Trisomy 13. The false positive rate is reported to be less than 0.1% for any of these conditions.  In addition, we discussed that ~50-80% of fetuses with Down syndrome, when well visualized, have detectable anomalies or soft markers by detailed ultrasound (~18+ weeks gestation).    This couple was also counseled regarding diagnostic testing via amniocentesis.  We reviewed the approximate 1 in 300-500 risk for complications, including spontaneous pregnancy loss. After consideration of all the options, the couple declined NIPT and amniocentesis at the time of today's visit. They elected to proceed with detailed ultrasound only.  A complete ultrasound was performed today.  The ultrasound report will be sent under separate cover.   Ms. ATHINA FAHEY was provided with written information regarding cystic fibrosis (CF) including the carrier frequency and incidence in the Caucasian population, the availability of carrier testing and prenatal diagnosis if indicated.  In addition, we discussed that CF is routinely screened for as part of the [facility] newborn screening panel.  She declined CF carrier testing today.   Both family histories were reviewed and found to be noncontributory for birth defects, mental retardation, recurrent pregnancy loss, and known genetic conditions. Without further information regarding the provided family history, an accurate genetic risk cannot be calculated. Further genetic counseling is warranted if more information is obtained.  Ms. MALEEYA PETERKIN denied exposure to environmental toxins or chemical agents. She denied the use of alcohol, tobacco or street drugs. She denied significant viral illnesses during the course of her pregnancy. Her medical and surgical histories were noncontributory.   I counseled this couple for approximately 40 minutes regarding the above risks and available options.     Quinn Plowman, MS,  Certified Genetic Counselor 01/18/2013

## 2013-01-19 ENCOUNTER — Encounter: Payer: Self-pay | Admitting: Advanced Practice Midwife

## 2013-02-01 ENCOUNTER — Ambulatory Visit (INDEPENDENT_AMBULATORY_CARE_PROVIDER_SITE_OTHER): Payer: Self-pay | Admitting: Family

## 2013-02-01 ENCOUNTER — Encounter: Payer: Self-pay | Admitting: Family

## 2013-02-01 VITALS — BP 121/77 | Temp 97.3°F | Wt 241.9 lb

## 2013-02-01 DIAGNOSIS — O280 Abnormal hematological finding on antenatal screening of mother: Secondary | ICD-10-CM

## 2013-02-01 DIAGNOSIS — O289 Unspecified abnormal findings on antenatal screening of mother: Secondary | ICD-10-CM

## 2013-02-01 LAB — POCT URINALYSIS DIP (DEVICE)
Bilirubin Urine: NEGATIVE
Glucose, UA: NEGATIVE mg/dL
Hgb urine dipstick: NEGATIVE
Ketones, ur: NEGATIVE mg/dL
Specific Gravity, Urine: 1.02 (ref 1.005–1.030)

## 2013-02-01 NOTE — Progress Notes (Signed)
Pulse- 90  Edema-feet Pt had [place] with different dating which one?

## 2013-02-01 NOTE — Progress Notes (Signed)
No questions or concerns; reviewed ultrasound results and resolution of marginal placenta; schedule cervical length ultrasound; CT test of cure in two weeks.

## 2013-02-01 NOTE — Progress Notes (Signed)
U/S scheduled 02/15/13 at 815 am.

## 2013-02-07 ENCOUNTER — Encounter: Payer: Self-pay | Admitting: *Deleted

## 2013-02-14 ENCOUNTER — Ambulatory Visit (INDEPENDENT_AMBULATORY_CARE_PROVIDER_SITE_OTHER): Payer: Self-pay | Admitting: Advanced Practice Midwife

## 2013-02-14 ENCOUNTER — Other Ambulatory Visit: Payer: Self-pay | Admitting: Obstetrics and Gynecology

## 2013-02-14 ENCOUNTER — Encounter: Payer: Self-pay | Admitting: Advanced Practice Midwife

## 2013-02-14 VITALS — BP 125/83 | Temp 97.8°F | Wt 247.9 lb

## 2013-02-14 DIAGNOSIS — O26879 Cervical shortening, unspecified trimester: Secondary | ICD-10-CM

## 2013-02-14 DIAGNOSIS — O98319 Other infections with a predominantly sexual mode of transmission complicating pregnancy, unspecified trimester: Secondary | ICD-10-CM

## 2013-02-14 DIAGNOSIS — K0889 Other specified disorders of teeth and supporting structures: Secondary | ICD-10-CM

## 2013-02-14 DIAGNOSIS — A749 Chlamydial infection, unspecified: Secondary | ICD-10-CM

## 2013-02-14 DIAGNOSIS — K089 Disorder of teeth and supporting structures, unspecified: Secondary | ICD-10-CM

## 2013-02-14 LAB — POCT URINALYSIS DIP (DEVICE)
Ketones, ur: NEGATIVE mg/dL
Protein, ur: NEGATIVE mg/dL
Urobilinogen, UA: 0.2 mg/dL (ref 0.0–1.0)
pH: 6 (ref 5.0–8.0)

## 2013-02-14 NOTE — Progress Notes (Signed)
Pulse- 99 Patient reports feeling baby once a day, no real kicks, but this is not any less than what she has been feeling  Patient reports a lot of pain coming from her tooth radiating back to her jaw- says the only thing that helps is advil, Tylenol doesn't work- does not have a Education officer, community

## 2013-02-14 NOTE — Progress Notes (Signed)
Having tooth pain. Will schedule a Dental referral today. Chlamydia test of cure done.

## 2013-02-14 NOTE — Addendum Note (Signed)
Addended by: Soyla Murphy T on: 02/14/2013 11:44 AM   Modules accepted: Orders

## 2013-02-14 NOTE — Progress Notes (Signed)
Called [facility] to make appointment- patient has not finished applying for Medicaid- plans to do that today- and can not afford $125 to be seen -so they did not give an appointment-  gave patient phone number and address of [facility] and instructed her she can call for appointment as soon as she has mediciad or the $125 and let us know so we can send the referral.

## 2013-02-14 NOTE — Patient Instructions (Signed)

## 2013-02-15 ENCOUNTER — Other Ambulatory Visit: Payer: Self-pay | Admitting: Family

## 2013-02-15 ENCOUNTER — Ambulatory Visit (HOSPITAL_COMMUNITY)
Admission: RE | Admit: 2013-02-15 | Discharge: 2013-02-15 | Disposition: A | Discharge status: Home / Self Care | Payer: Medicaid Other | Source: Ambulatory Visit | Attending: Family | Admitting: Family

## 2013-02-15 DIAGNOSIS — O26879 Cervical shortening, unspecified trimester: Secondary | ICD-10-CM | POA: Insufficient documentation

## 2013-02-15 DIAGNOSIS — O280 Abnormal hematological finding on antenatal screening of mother: Secondary | ICD-10-CM

## 2013-02-15 DIAGNOSIS — O4400 Placenta previa specified as without hemorrhage, unspecified trimester: Secondary | ICD-10-CM | POA: Insufficient documentation

## 2013-02-22 ENCOUNTER — Encounter: Payer: Self-pay | Admitting: Family

## 2013-03-14 ENCOUNTER — Ambulatory Visit (INDEPENDENT_AMBULATORY_CARE_PROVIDER_SITE_OTHER): Payer: Medicaid Other | Admitting: Obstetrics and Gynecology

## 2013-03-14 VITALS — BP 127/78 | Temp 97.3°F | Wt 251.8 lb

## 2013-03-14 DIAGNOSIS — O98319 Other infections with a predominantly sexual mode of transmission complicating pregnancy, unspecified trimester: Secondary | ICD-10-CM

## 2013-03-14 DIAGNOSIS — O98812 Other maternal infectious and parasitic diseases complicating pregnancy, second trimester: Secondary | ICD-10-CM

## 2013-03-14 LAB — POCT URINALYSIS DIP (DEVICE)
Bilirubin Urine: NEGATIVE
Ketones, ur: NEGATIVE mg/dL
Leukocytes, UA: NEGATIVE
Protein, ur: NEGATIVE mg/dL
Specific Gravity, Urine: >=1.03 (ref 1.005–1.030)
pH: 6.5 (ref 5.0–8.0)

## 2013-03-14 NOTE — Progress Notes (Signed)
Pulse: 100

## 2013-03-14 NOTE — Progress Notes (Signed)
3/6 [place] cx 3.4 normal length and previa resolved. Will stop prometrium. S/sx PTL reviewed. Dental pain resolved.

## 2013-03-14 NOTE — Patient Instructions (Signed)

## 2013-03-20 ENCOUNTER — Encounter: Payer: Self-pay | Admitting: *Deleted

## 2013-04-04 ENCOUNTER — Other Ambulatory Visit: Payer: Self-pay | Admitting: Family

## 2013-04-04 ENCOUNTER — Ambulatory Visit (INDEPENDENT_AMBULATORY_CARE_PROVIDER_SITE_OTHER): Payer: Medicaid Other | Admitting: Obstetrics & Gynecology

## 2013-04-04 ENCOUNTER — Encounter: Payer: Self-pay | Admitting: Obstetrics & Gynecology

## 2013-04-04 VITALS — BP 131/84 | Temp 97.0°F | Wt 257.4 lb

## 2013-04-04 DIAGNOSIS — O26879 Cervical shortening, unspecified trimester: Secondary | ICD-10-CM

## 2013-04-04 LAB — POCT URINALYSIS DIP (DEVICE)
Bilirubin Urine: NEGATIVE
Glucose, UA: NEGATIVE mg/dL
Ketones, ur: NEGATIVE mg/dL
Protein, ur: NEGATIVE mg/dL
Specific Gravity, Urine: >=1.03 (ref 1.005–1.030)

## 2013-04-04 LAB — CBC
HCT: 33.5 % — ABNORMAL LOW (ref 36.0–46.0)
Hemoglobin: 11 g/dL — ABNORMAL LOW (ref 12.0–15.0)
MCV: 92.5 fL (ref 78.0–100.0)
RDW: 12.9 % (ref 11.5–15.5)
WBC: 11.5 10*3/uL — ABNORMAL HIGH (ref 4.0–10.5)

## 2013-04-04 NOTE — Progress Notes (Signed)
Pt with no complaints.  No smoking.  Discussed partner smoking and second hand smoke.  +FM, no ctx, No LOF, No VB 1hr GTT, RPR, HIV and Hct today

## 2013-04-04 NOTE — Patient Instructions (Signed)
Gestational Diabetes Mellitus Gestational diabetes mellitus (GDM) is diabetes that occurs only during pregnancy. This happens when the body cannot properly handle the glucose (sugar) that increases in the blood after eating. During pregnancy, insulin resistance (reduced sensitivity to insulin) occurs because of the release of hormones from the placenta. Usually, the pancreas of pregnant women produces enough insulin to overcome the resistance that occurs. However, in gestational diabetes, the insulin is there but it does not work effectively. If the resistance is severe enough that the pancreas does not produce enough insulin, extra glucose builds up in the blood.  WHO IS AT RISK FOR DEVELOPING GESTATIONAL DIABETES?  Women with a history of diabetes in the family.  Women over age 25.  Women who are overweight.  Women in certain ethnic groups (Hispanic, African American, Native American, Asian and Pacific Islander). WHAT CAN HAPPEN TO THE BABY? If the mother's blood glucose is too high while she is pregnant, the extra sugar will travel through the umbilical cord to the baby. Some of the problems the baby may have are:  Large Baby - If the baby receives too much sugar, the baby will gain more weight. This may cause the baby to be too large to be born normally (vaginally) and a Cesarean section (C-section) may be needed.  Low Blood Glucose (hypoglycemia)  The baby makes extra insulin, in response to the extra sugar its gets from its mother. When the baby is born and no longer needs this extra insulin, the baby's blood glucose level may drop.  Jaundice (yellow coloring of the skin and eyes)  This is fairly common in babies. It is caused from a build-up of the chemical called bilirubin. This is rarely serious, but is seen more often in babies whose mothers had gestational diabetes. RISKS TO THE MOTHER Women who have had gestational diabetes may be at higher risk for some problems,  including:  Preeclampsia or toxemia, which includes problems with high blood pressure. Blood pressure and protein levels in the urine must be checked frequently.  Infections.  Cesarean section (C-section) for delivery.  Developing Type 2 diabetes later in life. About 30-50% will develop diabetes later, especially if obese. DIAGNOSIS  The hormones that cause insulin resistance are highest at about 24-28 weeks of pregnancy. If symptoms are experienced, they are much like symptoms you would normally expect during pregnancy.  GDM is often diagnosed using a two part method: 1. After 24-28 weeks of pregnancy, the woman drinks a glucose solution and takes a blood test. If the glucose level is high, a second test will be given. 2. Oral Glucose Tolerance Test (OGTT) which is 3 hours long  After not eating overnight, the blood glucose is checked. The woman drinks a glucose solution, and hourly blood glucose tests are taken. If the woman has risk factors for GDM, the caregiver may test earlier than 24 weeks of pregnancy. TREATMENT  Treatment of GDM is directed at keeping the mother's blood glucose level normal, and may include:  Meal planning.  Taking insulin or other medicine to control your blood glucose level.  Exercise.  Keeping a daily record of the foods you eat.  Blood glucose monitoring and keeping a record of your blood glucose levels.  May monitor ketone levels in the urine, although this is no longer considered necessary in most pregnancies. HOME CARE INSTRUCTIONS  While you are pregnant:  Follow your caregiver's advice regarding your prenatal appointments, meal planning, exercise, medicines, vitamins, blood and other tests, and physical   activities.  Keep a record of your meals, blood glucose tests, and the amount of insulin you are taking (if any). Show this to your caregiver at every prenatal visit.  If you have GDM, you may have problems with hypoglycemia (low blood glucose).  You may suspect this if you become suddenly dizzy, feel shaky, and/or weak. If you think this is happening and you have a glucose meter, try to test your blood glucose level. Follow your caregiver's advice for when and how to treat your low blood glucose. Generally, the 15:15 rule is followed: Treat by consuming 15 grams of carbohydrates, wait 15 minutes, and recheck blood glucose. Examples of 15 grams of carbohydrates are:  1 cup skim or low-fat milk.   cup juice.  3-4 glucose tablets.  5-6 hard candies.  1 small box raisins.   cup regular soda pop.  Practice good hygiene, to avoid infections.  Do not smoke. SEEK MEDICAL CARE IF:   You develop abnormal vaginal discharge, with or without itching.  You become weak and tired more than expected.  You seem to sweat a lot.  You have a sudden increase in weight, 5 pounds or more in one week.  You are losing weight, 3 pounds or more in a week.  Your blood glucose level is high, and you need instructions on what to do about it. SEEK IMMEDIATE MEDICAL CARE IF:   You develop a severe headache.  You faint or pass out.  You develop nausea and vomiting.  You become disoriented or confused.  You have a convulsion.  You develop vision problems.  You develop stomach pain.  You develop vaginal bleeding.  You develop uterine contractions.  You have leaking or a gush of fluid from the vagina. AFTER YOU HAVE THE BABY:  Go to all of your follow-up appointments, and have blood tests as advised by your caregiver.  Maintain a healthy lifestyle, to prevent diabetes in the future. This includes:  Following a healthy meal plan.  Controlling your weight.  Getting enough exercise and proper rest.  Do not smoke.  Breastfeed your baby if you can. This will lower the chance of you and your baby developing diabetes later in life. For more information about diabetes, go to the American Diabetes Association at:  www.americandiabetesassociation.org. For more information about gestational diabetes, go to the American Congress of Obstetricians and Gynecologists at: www.acog.org. Document Released: 03/07/2001 Document Revised: 02/21/2012 Document Reviewed: 09/29/2009 [employer] Patient Information 2013 [employer].  

## 2013-04-04 NOTE — Progress Notes (Signed)
Pulse- 98  Pressure- lower abd Pt c/o tingling in hands

## 2013-04-05 LAB — HIV ANTIBODY (ROUTINE TESTING W REFLEX): HIV: NONREACTIVE

## 2013-04-05 LAB — RPR

## 2013-04-06 ENCOUNTER — Encounter: Payer: Self-pay | Admitting: Obstetrics & Gynecology

## 2013-04-06 NOTE — Progress Notes (Signed)
NOTE: this patient was given a 50gm glucose load prior to seeing that she had an elevated 1hr early in the pregnancy.  I reviewed with patient the importance of following up for a 3 hour if her glc was elevated after the 50gm load.  Her 1 hr value is 177.  Left a msg for her to be scheduled for a 3hr GTT.

## 2013-04-09 ENCOUNTER — Encounter (HOSPITAL_COMMUNITY): Payer: Self-pay

## 2013-04-09 ENCOUNTER — Inpatient Hospital Stay (HOSPITAL_COMMUNITY)
Admission: AD | Admit: 2013-04-09 | Discharge: 2013-04-09 | Disposition: A | Discharge status: Home / Self Care | Payer: Medicaid Other | Source: Ambulatory Visit | Attending: Obstetrics and Gynecology | Admitting: Obstetrics and Gynecology

## 2013-04-09 DIAGNOSIS — R1013 Epigastric pain: Secondary | ICD-10-CM | POA: Insufficient documentation

## 2013-04-09 DIAGNOSIS — O280 Abnormal hematological finding on antenatal screening of mother: Secondary | ICD-10-CM

## 2013-04-09 DIAGNOSIS — R109 Unspecified abdominal pain: Secondary | ICD-10-CM

## 2013-04-09 DIAGNOSIS — O99810 Abnormal glucose complicating pregnancy: Secondary | ICD-10-CM

## 2013-04-09 DIAGNOSIS — R0602 Shortness of breath: Secondary | ICD-10-CM | POA: Insufficient documentation

## 2013-04-09 DIAGNOSIS — M549 Dorsalgia, unspecified: Secondary | ICD-10-CM | POA: Insufficient documentation

## 2013-04-09 DIAGNOSIS — O99891 Other specified diseases and conditions complicating pregnancy: Secondary | ICD-10-CM | POA: Insufficient documentation

## 2013-04-09 HISTORY — DX: Other maternal infectious and parasitic diseases complicating pregnancy, unspecified trimester: A74.9

## 2013-04-09 HISTORY — DX: Other maternal infectious and parasitic diseases complicating pregnancy, unspecified trimester: O98.819

## 2013-04-09 HISTORY — DX: Partial placenta previa nos or without hemorrhage, unspecified trimester: O44.20

## 2013-04-09 HISTORY — DX: Anxiety disorder, unspecified: F41.9

## 2013-04-09 HISTORY — DX: Cervical shortening, unspecified trimester: O26.879

## 2013-04-09 LAB — URINE MICROSCOPIC-ADD ON

## 2013-04-09 LAB — URINALYSIS, ROUTINE W REFLEX MICROSCOPIC
Glucose, UA: NEGATIVE mg/dL
Ketones, ur: NEGATIVE mg/dL
Nitrite: NEGATIVE
Specific Gravity, Urine: 1.02 (ref 1.005–1.030)
pH: 6 (ref 5.0–8.0)

## 2013-04-09 LAB — RAPID URINE DRUG SCREEN, HOSP PERFORMED
Amphetamines: NOT DETECTED
Opiates: NOT DETECTED

## 2013-04-09 MED ORDER — GI COCKTAIL ~~LOC~~
30.0000 mL | Freq: Once | ORAL | Status: AC
  Administered 2013-04-09: 30 mL via ORAL
  Filled 2013-04-09 (×2): qty 30

## 2013-04-09 NOTE — MAU Note (Signed)
Dr. Pollie Meyer returned call. Pt unsure of care plan. GI cocktail given, pt also given water pitcher. Provider to come see pt

## 2013-04-09 NOTE — MAU Provider Note (Signed)
History     CSN: [id number]  Arrival date and time: 04/09/13 0747   None     Chief Complaint  Patient presents with  . Shortness of Breath  . Nausea  . Abdominal Pain   HPI  Audrey Collins is a 22 y.o. G1P0 at [redacted]w[redacted]d who presents complaining of back pain, epigastric pain, and trouble breathing.  She reports that last night she had shortness of breath which was keeping her awake. She thinks she may have had a panic attack causing her shortness of breath. She has been having panic attacks a couple of times per week. She says she was hyperventilating. Has not noticed herself wheezing. She has had some cough. She wonders if it is from her cats. She reports that she already began to feel better when she was on the way here to the MAU.   She has pain in her lower mid back and in her anterior epigastric area. Thinks it might be heartburn related. Last night she took advil 400mg  for the pain, and it helped. She felt warm but did not take her temperature. She is aware that advil is not advised in pregnancy but says she did not have anything else at home to take.   She denies vaginal bleeding, leaking of fluid or regular contractions. She does have once cramp in her side every few days when she stretches. She does feel baby move. Did have some nausea but no vomiting.   She gets her care here in the high risk clinic downstairs. This pregnancy has been complicated by a shortened cervix (2.5cm at 16.1 weeks) which subsequently improved.   OB History   Grav Para Term Preterm Abortions TAB SAB Ect Mult Living   1               Past Medical History  Diagnosis Date  . Placenta previa marginalis     resolved per pt  . Anxiety   . Short cervix affecting pregnancy     took prometrium, resolved  . Chlamydia infection complicating pregnancy     Past Surgical History  Procedure Laterality Date  . No past surgeries      Family History  Problem Relation Age of Onset  . Hypertension  Father   . Thyroid disease Mother   . Diabetes Paternal Grandfather     History  Substance Use Topics  . Smoking status: Former Games developer  . Smokeless tobacco: Never Used  . Alcohol Use: No     Comment: none since finding out pregnant  no cigarettes while pregnant No drugs or alcohol  Allergies: No Known Allergies  Prescriptions prior to admission  Medication Sig Dispense Refill  . ibuprofen (ADVIL,MOTRIN) 200 MG tablet Take 400 mg by mouth daily as needed for pain.       . Prenatal Vit-Fe Fumarate-FA (PRENATAL MULTIVITAMIN) TABS Take 1 tablet by mouth daily.        ROS + nausea, no vomiting, + fetal movement, no bleeding, no leaking of fluid, + SOB, + epigastric pain, + back pain Physical Exam   Blood pressure 95/78, pulse 114, temperature 97.7 F (36.5 C), temperature source Oral, resp. rate 20, height 5\' 10"  (1.778 m), weight 262 lb 6 oz (119.013 kg), last menstrual period 09/12/2012, SpO2 99.00%.  Physical Exam Gen: NAD Heart: RRR Lungs: CTAB, NWOB Abd: gravid but otherwise soft, nontender to palpation Neuro: grossly nonfocal, speech intact Psych: affect is pleasant. Speech normal in rate and volume. Normal eye contact. Well  groomed. Back: paraspinal muscles tender to palpation  MAU Course  Procedures  Results for orders placed during the hospital encounter of 04/09/13 (from the past 24 hour(s))  URINALYSIS, ROUTINE W REFLEX MICROSCOPIC     Status: Abnormal   Collection Time    04/09/13  7:55 AM      Result Value Range   Color, Urine YELLOW  YELLOW   APPearance CLEAR  CLEAR   Specific Gravity, Urine 1.020  1.005 - 1.030   pH 6.0  5.0 - 8.0   Glucose, UA NEGATIVE  NEGATIVE mg/dL   Hgb urine dipstick NEGATIVE  NEGATIVE   Bilirubin Urine NEGATIVE  NEGATIVE   Ketones, ur NEGATIVE  NEGATIVE mg/dL   Protein, ur NEGATIVE  NEGATIVE mg/dL   Urobilinogen, UA 0.2  0.0 - 1.0 mg/dL   Nitrite NEGATIVE  NEGATIVE   Leukocytes, UA TRACE (*) NEGATIVE  URINE MICROSCOPIC-ADD ON      Status: Abnormal   Collection Time    04/09/13  7:55 AM      Result Value Range   Squamous Epithelial / LPF FEW (*) RARE   WBC, UA 0-2  <3 WBC/hpf   Bacteria, UA FEW (*) RARE  URINE RAPID DRUG SCREEN (HOSP PERFORMED)     Status: None   Collection Time    04/09/13  7:55 AM      Result Value Range   Opiates NONE DETECTED  NONE DETECTED   Cocaine NONE DETECTED  NONE DETECTED   Benzodiazepines NONE DETECTED  NONE DETECTED   Amphetamines NONE DETECTED  NONE DETECTED   Tetrahydrocannabinol NONE DETECTED  NONE DETECTED   Barbiturates NONE DETECTED  NONE DETECTED    Assessment and Plan  Audrey Collins is a 22 y.o. G1P0 at [redacted]w[redacted]d who presents complaining of back pain, epigastric pain, and shortness of breath.  Shortness of breath most likely related to anxiety as patient self identifies it as being from a panic attack, and it spontaneously improved. No findings on exam to suggest organic lung process. Pt is in no distress at all in MAU and is satting well on RA. I have provided this pt with the phone #'s for [facility] and the [facility] so that she can pursue evaluation and therapy. She is agreeable to this plan.  Epigastric pain most likely from GERD. Pt was given a GI cocktail in the MAU and reported resolution of her symptoms. Offered her an outpatient rx for heartburn medicine, but she declined, saying it has not been bad enough to need an rx.   Back pain most likely musculoskeletal. Counseled patient NOT to take ibuprofen while pregnant, explained she can do tylenol instead. She stated understanding of these instructions.  Pt has a f/u appointment scheduled on 5/7 in the clinic here at [facility].  Levert Feinstein 04/09/2013, 9:37 AM   Seen also by me Agree with note Wynelle Bourgeois CNM

## 2013-04-09 NOTE — MAU Note (Signed)
Pt sitting upright in bed, [place] style texting.  Maternal tracing on monitor.  Explained to pt.  Repositioned and monitors adjusted.

## 2013-04-09 NOTE — MAU Note (Signed)
Pt states beginning last pm noted shortness of breath. Increases with lying down. Also notes upper abd pain, nausea, and upper back pain that is worth with inspiration. Denies constipation or diarrhea. Denies bleeding or abnormal vag d/c changes.

## 2013-04-09 NOTE — MAU Note (Signed)
GI cocktail had no adverse effects. Pain gone

## 2013-04-10 ENCOUNTER — Telehealth: Payer: Self-pay | Admitting: *Deleted

## 2013-04-10 NOTE — Telephone Encounter (Addendum)
Message copied by Jill Side on Tue Apr 10, 2013  3:10 PM ------      Message from: Willodean Rosenthal      Created: Fri Apr 06, 2013 11:58 AM       This pt needs a 3 hour GTT. ASAP            Thx,      clh-S             ------called pt and informed her of abnormal 1hr GTT and need for 3hr test.  Pt voiced understanding and agreed to have 3hr GTT on 5/2 @ 0830.

## 2013-04-11 NOTE — MAU Provider Note (Signed)
Attestation of Attending Supervision of Advanced Practitioner (CNM/NP): Evaluation and management procedures were performed by the Advanced Practitioner under my supervision and collaboration.  I have reviewed the Advanced Practitioner's note and chart, and I agree with the management and plan.  Machaela Caterino 04/11/2013 8:51 AM

## 2013-04-13 ENCOUNTER — Other Ambulatory Visit: Payer: Medicaid Other

## 2013-04-18 ENCOUNTER — Encounter: Payer: Medicaid Other | Admitting: Obstetrics and Gynecology

## 2013-04-20 ENCOUNTER — Other Ambulatory Visit: Payer: Medicaid Other

## 2013-04-20 DIAGNOSIS — R7309 Other abnormal glucose: Secondary | ICD-10-CM

## 2013-04-21 LAB — GLUCOSE TOLERANCE, 3 HOURS
Glucose Tolerance, 1 hour: 186 mg/dL (ref 70–189)
Glucose Tolerance, 2 hour: 141 mg/dL (ref 70–164)
Glucose Tolerance, Fasting: 87 mg/dL (ref 70–104)

## 2013-04-24 ENCOUNTER — Encounter: Payer: Self-pay | Admitting: Obstetrics & Gynecology

## 2013-04-25 ENCOUNTER — Encounter: Payer: Medicaid Other | Admitting: Advanced Practice Midwife

## 2013-05-02 ENCOUNTER — Ambulatory Visit (INDEPENDENT_AMBULATORY_CARE_PROVIDER_SITE_OTHER): Payer: Medicaid Other | Admitting: Family Medicine

## 2013-05-02 VITALS — BP 118/89 | Temp 96.1°F | Wt 261.9 lb

## 2013-05-02 DIAGNOSIS — O09893 Supervision of other high risk pregnancies, third trimester: Secondary | ICD-10-CM

## 2013-05-02 DIAGNOSIS — O09899 Supervision of other high risk pregnancies, unspecified trimester: Secondary | ICD-10-CM

## 2013-05-02 DIAGNOSIS — O99810 Abnormal glucose complicating pregnancy: Secondary | ICD-10-CM

## 2013-05-02 LAB — POCT URINALYSIS DIP (DEVICE)
Bilirubin Urine: NEGATIVE
Glucose, UA: NEGATIVE mg/dL
Nitrite: NEGATIVE
Urobilinogen, UA: 1 mg/dL (ref 0.0–1.0)

## 2013-05-02 NOTE — Patient Instructions (Signed)

## 2013-05-02 NOTE — Progress Notes (Signed)
No complaints

## 2013-05-16 ENCOUNTER — Encounter: Payer: Self-pay | Admitting: Obstetrics and Gynecology

## 2013-05-16 ENCOUNTER — Ambulatory Visit (INDEPENDENT_AMBULATORY_CARE_PROVIDER_SITE_OTHER): Payer: Medicaid Other | Admitting: Obstetrics and Gynecology

## 2013-05-16 VITALS — BP 131/86 | Temp 97.6°F | Wt 266.9 lb

## 2013-05-16 DIAGNOSIS — O26839 Pregnancy related renal disease, unspecified trimester: Secondary | ICD-10-CM

## 2013-05-16 DIAGNOSIS — O1213 Gestational proteinuria, third trimester: Secondary | ICD-10-CM

## 2013-05-16 DIAGNOSIS — O99810 Abnormal glucose complicating pregnancy: Secondary | ICD-10-CM

## 2013-05-16 LAB — POCT URINALYSIS DIP (DEVICE)
Bilirubin Urine: NEGATIVE
Nitrite: NEGATIVE
Protein, ur: 30 mg/dL — AB
pH: 6 (ref 5.0–8.0)

## 2013-05-16 NOTE — Progress Notes (Signed)
Occ UC - discussed.BP still borderline>watch. 1 abnl on 3hr OGGT> recommended no added sweets. Consider [place] if continues S>D. Mo had preE. Tr LE and Dip proteinuria 30 mg> if urine cult neg will get 24 hr if persists. RTC 1 wk for  BP and urine check.

## 2013-05-16 NOTE — Patient Instructions (Signed)
Fetal Movement Counts Patient Name: __________________________________________________ Patient Due Date: ____________________ Performing a fetal movement count is highly recommended in high-risk pregnancies, but it is good for every pregnant woman to do. Your caregiver may ask you to start counting fetal movements at 28 weeks of the pregnancy. Fetal movements often increase:  After eating a full meal.  After physical activity.  After eating or drinking something sweet or cold.  At rest. Pay attention to when you feel the baby is most active. This will help you notice a pattern of your baby's sleep and wake cycles and what factors contribute to an increase in fetal movement. It is important to perform a fetal movement count at the same time each day when your baby is normally most active.  HOW TO COUNT FETAL MOVEMENTS 1. Find a quiet and comfortable area to sit or lie down on your left side. Lying on your left side provides the best blood and oxygen circulation to your baby. 2. Write down the day and time on a sheet of paper or in a journal. 3. Start counting kicks, flutters, swishes, rolls, or jabs in a 2 hour period. You should feel at least 10 movements within 2 hours. 4. If you do not feel 10 movements in 2 hours, wait 2 3 hours and count again. Look for a change in the pattern or not enough counts in 2 hours. SEEK MEDICAL CARE IF:  You feel less than 10 counts in 2 hours, tried twice.  There is no movement in over an hour.  The pattern is changing or taking longer each day to reach 10 counts in 2 hours.  You feel the baby is not moving as he or she usually does. Date: ____________ Movements: ____________ Start time: ____________ Finish time: ____________  Date: ____________ Movements: ____________ Start time: ____________ Finish time: ____________ Date: ____________ Movements: ____________ Start time: ____________ Finish time: ____________ Date: ____________ Movements: ____________  Start time: ____________ Finish time: ____________ Date: ____________ Movements: ____________ Start time: ____________ Finish time: ____________ Date: ____________ Movements: ____________ Start time: ____________ Finish time: ____________ Date: ____________ Movements: ____________ Start time: ____________ Finish time: ____________ Date: ____________ Movements: ____________ Start time: ____________ Finish time: ____________  Date: ____________ Movements: ____________ Start time: ____________ Finish time: ____________ Date: ____________ Movements: ____________ Start time: ____________ Finish time: ____________ Date: ____________ Movements: ____________ Start time: ____________ Finish time: ____________ Date: ____________ Movements: ____________ Start time: ____________ Finish time: ____________ Date: ____________ Movements: ____________ Start time: ____________ Finish time: ____________ Date: ____________ Movements: ____________ Start time: ____________ Finish time: ____________ Date: ____________ Movements: ____________ Start time: ____________ Finish time: ____________  Date: ____________ Movements: ____________ Start time: ____________ Finish time: ____________ Date: ____________ Movements: ____________ Start time: ____________ Finish time: ____________ Date: ____________ Movements: ____________ Start time: ____________ Finish time: ____________ Date: ____________ Movements: ____________ Start time: ____________ Finish time: ____________ Date: ____________ Movements: ____________ Start time: ____________ Finish time: ____________ Date: ____________ Movements: ____________ Start time: ____________ Finish time: ____________ Date: ____________ Movements: ____________ Start time: ____________ Finish time: ____________  Date: ____________ Movements: ____________ Start time: ____________ Finish time: ____________ Date: ____________ Movements: ____________ Start time: ____________ Finish time:  ____________ Date: ____________ Movements: ____________ Start time: ____________ Finish time: ____________ Date: ____________ Movements: ____________ Start time: ____________ Finish time: ____________ Date: ____________ Movements: ____________ Start time: ____________ Finish time: ____________ Date: ____________ Movements: ____________ Start time: ____________ Finish time: ____________ Date: ____________ Movements: ____________ Start time: ____________ Finish time: ____________  Date: ____________ Movements: ____________ Start time: ____________ Finish   time: ____________ Date: ____________ Movements: ____________ Start time: ____________ Finish time: ____________ Date: ____________ Movements: ____________ Start time: ____________ Finish time: ____________ Date: ____________ Movements: ____________ Start time: ____________ Finish time: ____________ Date: ____________ Movements: ____________ Start time: ____________ Finish time: ____________ Date: ____________ Movements: ____________ Start time: ____________ Finish time: ____________ Date: ____________ Movements: ____________ Start time: ____________ Finish time: ____________  Date: ____________ Movements: ____________ Start time: ____________ Finish time: ____________ Date: ____________ Movements: ____________ Start time: ____________ Finish time: ____________ Date: ____________ Movements: ____________ Start time: ____________ Finish time: ____________ Date: ____________ Movements: ____________ Start time: ____________ Finish time: ____________ Date: ____________ Movements: ____________ Start time: ____________ Finish time: ____________ Date: ____________ Movements: ____________ Start time: ____________ Finish time: ____________ Date: ____________ Movements: ____________ Start time: ____________ Finish time: ____________  Date: ____________ Movements: ____________ Start time: ____________ Finish time: ____________ Date: ____________ Movements:  ____________ Start time: ____________ Finish time: ____________ Date: ____________ Movements: ____________ Start time: ____________ Finish time: ____________ Date: ____________ Movements: ____________ Start time: ____________ Finish time: ____________ Date: ____________ Movements: ____________ Start time: ____________ Finish time: ____________ Date: ____________ Movements: ____________ Start time: ____________ Finish time: ____________ Date: ____________ Movements: ____________ Start time: ____________ Finish time: ____________  Date: ____________ Movements: ____________ Start time: ____________ Finish time: ____________ Date: ____________ Movements: ____________ Start time: ____________ Finish time: ____________ Date: ____________ Movements: ____________ Start time: ____________ Finish time: ____________ Date: ____________ Movements: ____________ Start time: ____________ Finish time: ____________ Date: ____________ Movements: ____________ Start time: ____________ Finish time: ____________ Date: ____________ Movements: ____________ Start time: ____________ Finish time: ____________ Document Released: 12/29/2006 Document Revised: 11/15/2012 Document Reviewed: 09/25/2012 [employer] Patient Information 2014 [employer].  

## 2013-05-16 NOTE — Progress Notes (Signed)
Pulse- 106 Pain-"crampy x 1 day"  Edema-feet/hands

## 2013-05-18 LAB — CULTURE, OB URINE: Colony Count: NO GROWTH

## 2013-05-19 ENCOUNTER — Encounter (HOSPITAL_COMMUNITY): Payer: Self-pay | Admitting: Family

## 2013-05-19 ENCOUNTER — Inpatient Hospital Stay (HOSPITAL_COMMUNITY)
Admission: AD | Admit: 2013-05-19 | Discharge: 2013-05-19 | Disposition: A | Discharge status: Home / Self Care | Payer: Medicaid Other | Source: Ambulatory Visit | Attending: Obstetrics & Gynecology | Admitting: Obstetrics & Gynecology

## 2013-05-19 DIAGNOSIS — N390 Urinary tract infection, site not specified: Secondary | ICD-10-CM

## 2013-05-19 DIAGNOSIS — R109 Unspecified abdominal pain: Secondary | ICD-10-CM | POA: Insufficient documentation

## 2013-05-19 DIAGNOSIS — O26879 Cervical shortening, unspecified trimester: Secondary | ICD-10-CM | POA: Insufficient documentation

## 2013-05-19 DIAGNOSIS — O2390 Unspecified genitourinary tract infection in pregnancy, unspecified trimester: Secondary | ICD-10-CM | POA: Insufficient documentation

## 2013-05-19 DIAGNOSIS — N949 Unspecified condition associated with female genital organs and menstrual cycle: Secondary | ICD-10-CM

## 2013-05-19 LAB — URINALYSIS, ROUTINE W REFLEX MICROSCOPIC
Glucose, UA: NEGATIVE mg/dL
Hgb urine dipstick: NEGATIVE
Ketones, ur: NEGATIVE mg/dL
Protein, ur: NEGATIVE mg/dL
Urobilinogen, UA: 0.2 mg/dL (ref 0.0–1.0)

## 2013-05-19 LAB — URINE MICROSCOPIC-ADD ON

## 2013-05-19 MED ORDER — CEPHALEXIN 500 MG PO CAPS: 500.0000 mg | ORAL_CAPSULE | Freq: Three times a day (TID) | ORAL | Status: DC

## 2013-05-19 NOTE — MAU Provider Note (Signed)
History     CSN: [id number]  Arrival date and time: 05/19/13 1619   First Provider Initiated Contact with Patient 05/19/13 1750      No chief complaint on file.  HPI 22 y.o. G1P0 at [redacted]w[redacted]d with constant pelvic pressure/pain for 2 days. Pain is in middle/front of pelvis. No contractions, bleeding, LOF.   Past Medical History  Diagnosis Date  . Placenta previa marginalis     resolved per pt  . Anxiety   . Short cervix affecting pregnancy     took prometrium, resolved  . Chlamydia infection complicating pregnancy     Past Surgical History  Procedure Laterality Date  . No past surgeries      Family History  Problem Relation Age of Onset  . Hypertension Father   . Thyroid disease Mother   . Diabetes Paternal Grandfather   . Diabetes Maternal Grandfather     History  Substance Use Topics  . Smoking status: Former Games developer  . Smokeless tobacco: Never Used  . Alcohol Use: No     Comment: none since finding out pregnant    Allergies: No Known Allergies  No prescriptions prior to admission    Review of Systems  Constitutional: Negative.   Respiratory: Negative.   Cardiovascular: Negative.   Gastrointestinal: Negative for nausea, vomiting, abdominal pain, diarrhea and constipation.  Genitourinary: Negative for dysuria, urgency, frequency, hematuria and flank pain.       Negative for vaginal bleeding, cramping/contractions  Musculoskeletal: Negative.   Neurological: Negative.   Psychiatric/Behavioral: Negative.    Physical Exam   Blood pressure 128/85, pulse 116, temperature 97.9 F (36.6 C), temperature source Oral, resp. rate 18, height 5\' 10"  (1.778 m), weight 266 lb (120.657 kg), last menstrual period 09/12/2012.  Physical Exam  Nursing note and vitals reviewed. Constitutional: She is oriented to person, place, and time. She appears well-developed and well-nourished. No distress.  Cardiovascular: Normal rate.   Respiratory: Effort normal.  GI: Soft. There  is no tenderness.  Genitourinary:   Cervix: closed/thick/posterior   Musculoskeletal:  Tender symphysis pubis   Neurological: She is alert and oriented to person, place, and time.  Skin: Skin is warm and dry.  Psychiatric: She has a normal mood and affect.   EFM reactive, TOCO irritability MAU Course  Procedures Results for orders placed during the hospital encounter of 05/19/13 (from the past 72 hour(s))  URINALYSIS, ROUTINE W REFLEX MICROSCOPIC     Status: Abnormal   Collection Time    05/19/13  4:35 PM      Result Value Range   Color, Urine YELLOW  YELLOW   APPearance CLEAR  CLEAR   Specific Gravity, Urine 1.025  1.005 - 1.030   pH 6.0  5.0 - 8.0   Glucose, UA NEGATIVE  NEGATIVE mg/dL   Hgb urine dipstick NEGATIVE  NEGATIVE   Bilirubin Urine NEGATIVE  NEGATIVE   Ketones, ur NEGATIVE  NEGATIVE mg/dL   Protein, ur NEGATIVE  NEGATIVE mg/dL   Urobilinogen, UA 0.2  0.0 - 1.0 mg/dL   Nitrite NEGATIVE  NEGATIVE   Leukocytes, UA MODERATE (*) NEGATIVE  URINE MICROSCOPIC-ADD ON     Status: Abnormal   Collection Time    05/19/13  4:35 PM      Result Value Range   Squamous Epithelial / LPF FEW (*) RARE   WBC, UA 11-20  <3 WBC/hpf   Bacteria, UA FEW (*) RARE   Urine-Other MUCOUS PRESENT       Assessment and Plan  1. Pain of female symphysis pubis   2. UTI (urinary tract infection)       Medication List    TAKE these medications       acetaminophen 325 MG tablet  Commonly known as:  TYLENOL  Take 650 mg by mouth every 6 (six) hours as needed for pain.     cephALEXin 500 MG capsule  Commonly known as:  KEFLEX  Take 1 capsule (500 mg total) by mouth 3 (three) times daily.     diphenhydrAMINE 50 MG tablet  Commonly known as:  BENADRYL  Take 50 mg by mouth at bedtime as needed for itching.     prenatal multivitamin Tabs  Take 1 tablet by mouth daily.     ZANTAC PO  Take 2 tablets by mouth daily.            Follow-up Information   Follow up with [facility]. (as scheduled)    Contact information:   [address] [place] [address] [phone]        [facility] 05/19/2013, 6:09 PM

## 2013-05-19 NOTE — MAU Note (Signed)
Patient presents to MAU with c/o increased groin pressure x 2 days; reports pain was intermittent during last week until 2 days ago.  Denies vaginal bleeding, LOF, cramping. Reports +FM.

## 2013-05-20 LAB — URINE CULTURE: Colony Count: >=100000

## 2013-05-23 ENCOUNTER — Ambulatory Visit (INDEPENDENT_AMBULATORY_CARE_PROVIDER_SITE_OTHER): Payer: Medicaid Other | Admitting: Advanced Practice Midwife

## 2013-05-23 VITALS — BP 132/89 | Temp 98.0°F | Wt 268.1 lb

## 2013-05-23 DIAGNOSIS — O99810 Abnormal glucose complicating pregnancy: Secondary | ICD-10-CM

## 2013-05-23 DIAGNOSIS — O133 Gestational [pregnancy-induced] hypertension without significant proteinuria, third trimester: Secondary | ICD-10-CM

## 2013-05-23 DIAGNOSIS — O139 Gestational [pregnancy-induced] hypertension without significant proteinuria, unspecified trimester: Secondary | ICD-10-CM

## 2013-05-23 LAB — COMPREHENSIVE METABOLIC PANEL
ALT: 10 U/L (ref 0–35)
AST: 14 U/L (ref 0–37)
Albumin: 3.3 g/dL — ABNORMAL LOW (ref 3.5–5.2)
Alkaline Phosphatase: 168 U/L — ABNORMAL HIGH (ref 39–117)
BUN: 6 mg/dL (ref 6–23)
CO2: 23 mEq/L (ref 19–32)
Calcium: 9.6 mg/dL (ref 8.4–10.5)
Chloride: 105 mEq/L (ref 96–112)
Creat: 0.48 mg/dL — ABNORMAL LOW (ref 0.50–1.10)
Glucose, Bld: 96 mg/dL (ref 70–99)
Potassium: 4.4 mEq/L (ref 3.5–5.3)
Sodium: 139 mEq/L (ref 135–145)
Total Bilirubin: 0.4 mg/dL (ref 0.3–1.2)
Total Protein: 6.3 g/dL (ref 6.0–8.3)

## 2013-05-23 LAB — CBC
HCT: 34 % — ABNORMAL LOW (ref 36.0–46.0)
Hemoglobin: 11.2 g/dL — ABNORMAL LOW (ref 12.0–15.0)
MCH: 28.9 pg (ref 26.0–34.0)
MCHC: 32.9 g/dL (ref 30.0–36.0)
MCV: 87.9 fL (ref 78.0–100.0)
Platelets: 174 10*3/uL (ref 150–400)
RBC: 3.87 MIL/uL (ref 3.87–5.11)
RDW: 13.8 % (ref 11.5–15.5)
WBC: 9.5 10*3/uL (ref 4.0–10.5)

## 2013-05-23 LAB — POCT URINALYSIS DIP (DEVICE)
Nitrite: NEGATIVE
Protein, ur: 30 mg/dL — AB
Urobilinogen, UA: 0.2 mg/dL (ref 0.0–1.0)
pH: 7 (ref 5.0–8.0)

## 2013-05-23 NOTE — Progress Notes (Signed)
P=118, c/o trace edema in feet, hands, face. C/o feels pressure in face. C/o pelvic/groin pressure,/pain,

## 2013-05-23 NOTE — Patient Instructions (Signed)
Contraception Choices Contraception (birth control) is the use of any methods or devices to prevent pregnancy. Below are some methods to help avoid pregnancy. HORMONAL METHODS   Contraceptive implant. This is a thin, plastic tube containing progesterone hormone. It does not contain estrogen hormone. Your caregiver inserts the tube in the inner part of the upper arm. The tube can remain in place for up to 3 years. After 3 years, the implant must be removed. The implant prevents the ovaries from releasing an egg (ovulation), thickens the cervical mucus which prevents sperm from entering the uterus, and thins the lining of the inside of the uterus.  Progesterone-only injections. These injections are given every 3 months by your caregiver to prevent pregnancy. This synthetic progesterone hormone stops the ovaries from releasing eggs. It also thickens cervical mucus and changes the uterine lining. This makes it harder for sperm to survive in the uterus.  Minipill. This type of birth control pill contains only the progesterone hormone. They are taken every day of each month and must be prescribed by your caregiver.  Emergency contraception. Emergency contraceptives prevent pregnancy after unprotected sexual intercourse. This pill can be taken right after sex or up to 5 days after unprotected sex. It is most effective the sooner you take the pills after having sexual intercourse. Emergency contraceptive pills are available without a prescription. Check with your pharmacist. Do not use emergency contraception as your only form of birth control. BARRIER METHODS   Female condom. This is a thin sheath (latex or rubber) that is worn over the penis during sexual intercourse. It can be used with spermicide to increase effectiveness.  Female condom. This is a soft, loose-fitting sheath that is put into the vagina before sexual intercourse.  Diaphragm. This is a soft, latex, dome-shaped barrier that must be fitted by  a caregiver. It is inserted into the vagina, along with a spermicidal jelly. It is inserted before intercourse. The diaphragm should be left in the vagina for 6 to 8 hours after intercourse.  Cervical cap. This is a round, soft, latex or plastic cup that fits over the cervix and must be fitted by a caregiver. The cap can be left in place for up to 48 hours after intercourse.  Sponge. This is a soft, circular piece of polyurethane foam. The sponge has spermicide in it. It is inserted into the vagina after wetting it and before sexual intercourse.  Spermicides. These are chemicals that kill or block sperm from entering the cervix and uterus. They come in the form of creams, jellies, suppositories, foam, or tablets. They do not require a prescription. They are inserted into the vagina with an applicator before having sexual intercourse. The process must be repeated every time you have sexual intercourse. INTRAUTERINE CONTRACEPTION  Intrauterine device (IUD). This is a T-shaped device that is put in a woman's uterus during a menstrual period to prevent pregnancy. There are 2 types:  Copper IUD. This type of IUD is wrapped in copper wire and is placed inside the uterus. Copper makes the uterus and fallopian tubes produce a fluid that kills sperm. It can stay in place for 10 years.  Hormone IUD. This type of IUD contains the hormone progestin (synthetic progesterone). The hormone thickens the cervical mucus and prevents sperm from entering the uterus, and it also thins the uterine lining to prevent implantation of a fertilized egg. The hormone can weaken or kill the sperm that get into the uterus. It can stay in place for 5  years. PERMANENT METHODS OF CONTRACEPTION  Female tubal ligation. This is when the woman's fallopian tubes are surgically sealed, tied, or blocked to prevent the egg from traveling to the uterus.  Female sterilization. This is when the female has the tubes that carry sperm tied off  (vasectomy).This blocks sperm from entering the vagina during sexual intercourse. After the procedure, the man can still ejaculate fluid (semen). NATURAL PLANNING METHODS  Natural family planning. This is not having sexual intercourse or using a barrier method (condom, diaphragm, cervical cap) on days the woman could become pregnant.  Calendar method. This is keeping track of the length of each menstrual cycle and identifying when you are fertile.  Ovulation method. This is avoiding sexual intercourse during ovulation.  Symptothermal method. This is avoiding sexual intercourse during ovulation, using a thermometer and ovulation symptoms.  Post-ovulation method. This is timing sexual intercourse after you have ovulated. Regardless of which type or method of contraception you choose, it is important that you use condoms to protect against the transmission of sexually transmitted diseases (STDs). Talk with your caregiver about which form of contraception is most appropriate for you. Document Released: 11/29/2005 Document Revised: 02/21/2012 Document Reviewed: 04/07/2011 [facility] Patient Information 2014 [place], [place].

## 2013-05-23 NOTE — Progress Notes (Signed)
Feeling well. Denies any headaches or blurry vision. GBS at next visit. Will do 24 hour urine 2/2 persistent 1+ proteinuria and borderline B/Ps.

## 2013-05-23 NOTE — Addendum Note (Signed)
Addended by: Franchot Mimes on: 05/23/2013 03:58 PM   Modules accepted: Orders

## 2013-05-29 ENCOUNTER — Ambulatory Visit (INDEPENDENT_AMBULATORY_CARE_PROVIDER_SITE_OTHER): Payer: Medicaid Other | Admitting: Obstetrics & Gynecology

## 2013-05-29 VITALS — BP 120/86 | Temp 97.7°F | Wt 272.4 lb

## 2013-05-29 DIAGNOSIS — N760 Acute vaginitis: Secondary | ICD-10-CM

## 2013-05-29 DIAGNOSIS — O280 Abnormal hematological finding on antenatal screening of mother: Secondary | ICD-10-CM

## 2013-05-29 DIAGNOSIS — O1213 Gestational proteinuria, third trimester: Secondary | ICD-10-CM

## 2013-05-29 DIAGNOSIS — A499 Bacterial infection, unspecified: Secondary | ICD-10-CM

## 2013-05-29 DIAGNOSIS — Z3493 Encounter for supervision of normal pregnancy, unspecified, third trimester: Secondary | ICD-10-CM

## 2013-05-29 DIAGNOSIS — O289 Unspecified abnormal findings on antenatal screening of mother: Secondary | ICD-10-CM

## 2013-05-29 DIAGNOSIS — N898 Other specified noninflammatory disorders of vagina: Secondary | ICD-10-CM

## 2013-05-29 DIAGNOSIS — B9689 Other specified bacterial agents as the cause of diseases classified elsewhere: Secondary | ICD-10-CM

## 2013-05-29 DIAGNOSIS — R809 Proteinuria, unspecified: Secondary | ICD-10-CM

## 2013-05-29 DIAGNOSIS — O9989 Other specified diseases and conditions complicating pregnancy, childbirth and the puerperium: Secondary | ICD-10-CM

## 2013-05-29 DIAGNOSIS — O26839 Pregnancy related renal disease, unspecified trimester: Secondary | ICD-10-CM

## 2013-05-29 LAB — COMPREHENSIVE METABOLIC PANEL
ALT: 10 U/L (ref 0–35)
Albumin: 3.2 g/dL — ABNORMAL LOW (ref 3.5–5.2)
CO2: 20 mEq/L (ref 19–32)
Calcium: 9.1 mg/dL (ref 8.4–10.5)
Chloride: 107 mEq/L (ref 96–112)
Potassium: 4.1 mEq/L (ref 3.5–5.3)
Sodium: 134 mEq/L — ABNORMAL LOW (ref 135–145)
Total Protein: 5.8 g/dL — ABNORMAL LOW (ref 6.0–8.3)

## 2013-05-29 LAB — OB RESULTS CONSOLE GC/CHLAMYDIA
Chlamydia: NEGATIVE
Gonorrhea: NEGATIVE

## 2013-05-29 LAB — POCT URINALYSIS DIP (DEVICE)
Ketones, ur: NEGATIVE mg/dL
Protein, ur: NEGATIVE mg/dL
Urobilinogen, UA: 0.2 mg/dL (ref 0.0–1.0)

## 2013-05-29 MED ORDER — FLUCONAZOLE 150 MG PO TABS: 150.0000 mg | ORAL_TABLET | Freq: Once | ORAL | Status: DC

## 2013-05-29 NOTE — Progress Notes (Signed)
Pulse- 100  Edema-feet  Pain-back  Pressure-lower abd Pt c/o vaginal irritation and itching w/o discharge

## 2013-05-29 NOTE — Progress Notes (Signed)
Returned 24 hour urine collection today that was ordered last visit due to persistent 1+ proteinuria and borderline BPs.  Today, no proteinuria noted, normal BP, no concerning symptoms.  Pelvic cultures done today. Wet prep also done, thick white discharge noted with some erythema around the introitus.  Diflucan presumptively prescribed.  No other complaints or concerns.  Fetal movement and labor precautions reviewed.

## 2013-05-29 NOTE — Patient Instructions (Signed)
Return to clinic for any obstetric concerns or go to MAU for evaluation  

## 2013-05-30 ENCOUNTER — Telehealth: Payer: Self-pay

## 2013-05-30 DIAGNOSIS — O280 Abnormal hematological finding on antenatal screening of mother: Secondary | ICD-10-CM

## 2013-05-30 DIAGNOSIS — Z3490 Encounter for supervision of normal pregnancy, unspecified, unspecified trimester: Secondary | ICD-10-CM

## 2013-05-30 DIAGNOSIS — O99810 Abnormal glucose complicating pregnancy: Secondary | ICD-10-CM

## 2013-05-30 LAB — PROTEIN, URINE, 24 HOUR
Protein, 24H Urine: 129 mg/d — ABNORMAL HIGH (ref 50–100)
Protein, Urine: 11 mg/dL

## 2013-05-30 LAB — GC/CHLAMYDIA PROBE AMP: GC Probe RNA: NEGATIVE

## 2013-05-30 LAB — CREATININE CLEARANCE, URINE, 24 HOUR: Creatinine, Urine: 160.4 mg/dL

## 2013-05-30 LAB — WET PREP, GENITAL

## 2013-05-30 MED ORDER — METRONIDAZOLE 500 MG PO TABS: 500.0000 mg | ORAL_TABLET | Freq: Two times a day (BID) | ORAL | Status: AC

## 2013-05-30 NOTE — Telephone Encounter (Signed)
Called pt and informed her that she has BV and that flagyl has been prescribed and sent to her pharmacy in which I verified. Pt stated understanding and had no further questions.

## 2013-05-30 NOTE — Telephone Encounter (Signed)
Message copied by Faythe Casa on Wed May 30, 2013  9:13 AM ------      Message from: Jaynie Collins A      Created: Wed May 30, 2013  8:35 AM       Wet prep showed clue cells, Metronidazole e-prescribed. Please call to inform patient of results and to pick up Rx.                    ------

## 2013-05-30 NOTE — Addendum Note (Signed)
Addended by: Jaynie Collins A on: 05/30/2013 08:35 AM   Modules accepted: Orders

## 2013-06-04 LAB — CULTURE, BETA STREP (GROUP B ONLY)

## 2013-06-05 ENCOUNTER — Encounter: Payer: Self-pay | Admitting: Obstetrics & Gynecology

## 2013-06-05 ENCOUNTER — Ambulatory Visit (INDEPENDENT_AMBULATORY_CARE_PROVIDER_SITE_OTHER): Payer: Medicaid Other | Admitting: Obstetrics & Gynecology

## 2013-06-05 VITALS — BP 125/78 | Wt 275.0 lb

## 2013-06-05 DIAGNOSIS — Z3493 Encounter for supervision of normal pregnancy, unspecified, third trimester: Secondary | ICD-10-CM

## 2013-06-05 DIAGNOSIS — O09899 Supervision of other high risk pregnancies, unspecified trimester: Secondary | ICD-10-CM

## 2013-06-05 DIAGNOSIS — O99820 Streptococcus B carrier state complicating pregnancy: Secondary | ICD-10-CM | POA: Insufficient documentation

## 2013-06-05 DIAGNOSIS — O99810 Abnormal glucose complicating pregnancy: Secondary | ICD-10-CM

## 2013-06-05 LAB — POCT URINALYSIS DIP (DEVICE)
Glucose, UA: NEGATIVE mg/dL
Nitrite: NEGATIVE
Urobilinogen, UA: 0.2 mg/dL (ref 0.0–1.0)

## 2013-06-05 NOTE — Progress Notes (Signed)
Routine visit. Good FM. Labor precautions reviewed. 

## 2013-06-05 NOTE — Progress Notes (Signed)
Routine visit. Good FM. Labor precautions reviewed. DTRs 2+. She denies headache, visual changes, or RUQ pain.

## 2013-06-13 ENCOUNTER — Encounter (HOSPITAL_COMMUNITY): Payer: Self-pay | Admitting: *Deleted

## 2013-06-13 ENCOUNTER — Inpatient Hospital Stay (HOSPITAL_COMMUNITY)
Admission: AD | Admit: 2013-06-13 | Discharge: 2013-06-18 | DRG: 774 | Disposition: A | Discharge status: Home / Self Care | Payer: Medicaid Other | Source: Ambulatory Visit | Attending: Obstetrics & Gynecology | Admitting: Obstetrics & Gynecology

## 2013-06-13 ENCOUNTER — Ambulatory Visit (INDEPENDENT_AMBULATORY_CARE_PROVIDER_SITE_OTHER): Payer: Medicaid Other | Admitting: Obstetrics & Gynecology

## 2013-06-13 VITALS — BP 142/93 | Temp 96.7°F | Wt 276.3 lb

## 2013-06-13 DIAGNOSIS — O99810 Abnormal glucose complicating pregnancy: Secondary | ICD-10-CM

## 2013-06-13 DIAGNOSIS — O133 Gestational [pregnancy-induced] hypertension without significant proteinuria, third trimester: Secondary | ICD-10-CM

## 2013-06-13 DIAGNOSIS — O139 Gestational [pregnancy-induced] hypertension without significant proteinuria, unspecified trimester: Principal | ICD-10-CM | POA: Diagnosis present

## 2013-06-13 DIAGNOSIS — O26899 Other specified pregnancy related conditions, unspecified trimester: Secondary | ICD-10-CM | POA: Diagnosis present

## 2013-06-13 DIAGNOSIS — Z3493 Encounter for supervision of normal pregnancy, unspecified, third trimester: Secondary | ICD-10-CM

## 2013-06-13 DIAGNOSIS — O99820 Streptococcus B carrier state complicating pregnancy: Secondary | ICD-10-CM

## 2013-06-13 DIAGNOSIS — O280 Abnormal hematological finding on antenatal screening of mother: Secondary | ICD-10-CM

## 2013-06-13 HISTORY — DX: Obesity, unspecified: E66.9

## 2013-06-13 LAB — POCT URINALYSIS DIP (DEVICE)
Glucose, UA: NEGATIVE mg/dL
Nitrite: NEGATIVE
Urobilinogen, UA: 1 mg/dL (ref 0.0–1.0)
pH: 5.5 (ref 5.0–8.0)

## 2013-06-13 LAB — COMPREHENSIVE METABOLIC PANEL
BUN: 6 mg/dL (ref 6–23)
Calcium: 9.6 mg/dL (ref 8.4–10.5)
GFR calc Af Amer: >90 mL/min (ref >90)
Glucose, Bld: 135 mg/dL — ABNORMAL HIGH (ref 70–99)
Sodium: 135 mEq/L (ref 135–145)
Total Protein: 6.3 g/dL (ref 6.0–8.3)

## 2013-06-13 LAB — PROTEIN / CREATININE RATIO, URINE
Protein Creatinine Ratio: 0.13 (ref 0.00–0.15)
Total Protein, Urine: 42.7 mg/dL

## 2013-06-13 LAB — TYPE AND SCREEN: Antibody Screen: NEGATIVE

## 2013-06-13 LAB — CBC
HCT: 34.9 % — ABNORMAL LOW (ref 36.0–46.0)
Hemoglobin: 11.4 g/dL — ABNORMAL LOW (ref 12.0–15.0)
RBC: 3.97 MIL/uL (ref 3.87–5.11)
WBC: 9.4 10*3/uL (ref 4.0–10.5)

## 2013-06-13 LAB — ABO/RH: ABO/RH(D): O POS

## 2013-06-13 MED ORDER — DIPHENHYDRAMINE HCL 50 MG/ML IJ SOLN: 12.5000 mg | INTRAMUSCULAR | Status: DC | PRN

## 2013-06-13 MED ORDER — TERBUTALINE SULFATE 1 MG/ML IJ SOLN: 0.2500 mg | Freq: Once | INTRAMUSCULAR | Status: AC | PRN

## 2013-06-13 MED ORDER — OXYTOCIN BOLUS FROM INFUSION: 500.0000 mL | INTRAVENOUS | Status: DC

## 2013-06-13 MED ORDER — ONDANSETRON HCL 4 MG/2ML IJ SOLN
4.0000 mg | Freq: Four times a day (QID) | INTRAMUSCULAR | Status: DC | PRN
  Administered 2013-06-14 – 2013-06-15 (×2): 4 mg via INTRAVENOUS
  Filled 2013-06-13 (×2): qty 2

## 2013-06-13 MED ORDER — PENICILLIN G POTASSIUM 5000000 UNITS IJ SOLR
5.0000 10*6.[IU] | Freq: Once | INTRAVENOUS | Status: AC
  Administered 2013-06-13: 5 10*6.[IU] via INTRAVENOUS
  Filled 2013-06-13: qty 5

## 2013-06-13 MED ORDER — OXYCODONE-ACETAMINOPHEN 5-325 MG PO TABS
1.0000 | ORAL_TABLET | ORAL | Status: DC | PRN
  Administered 2013-06-15: 1 via ORAL
  Filled 2013-06-13: qty 1

## 2013-06-13 MED ORDER — LACTATED RINGERS IV SOLN
500.0000 mL | Freq: Once | INTRAVENOUS | Status: AC
  Administered 2013-06-15: 500 mL via INTRAVENOUS

## 2013-06-13 MED ORDER — OXYTOCIN 40 UNITS IN LACTATED RINGERS INFUSION - SIMPLE MED
1.0000 m[IU]/min | INTRAVENOUS | Status: DC
  Administered 2013-06-13: 1 m[IU]/min via INTRAVENOUS
  Administered 2013-06-13: 4 m[IU]/min via INTRAVENOUS
  Administered 2013-06-13: 3 m[IU]/min via INTRAVENOUS
  Filled 2013-06-13: qty 1000

## 2013-06-13 MED ORDER — PENICILLIN G POTASSIUM 5000000 UNITS IJ SOLR
2.5000 10*6.[IU] | INTRAMUSCULAR | Status: DC
  Administered 2013-06-13 – 2013-06-15 (×12): 2.5 10*6.[IU] via INTRAVENOUS
  Filled 2013-06-13 (×15): qty 2.5

## 2013-06-13 MED ORDER — IBUPROFEN 600 MG PO TABS
600.0000 mg | ORAL_TABLET | Freq: Four times a day (QID) | ORAL | Status: DC | PRN
  Administered 2013-06-15: 600 mg via ORAL
  Filled 2013-06-13: qty 1

## 2013-06-13 MED ORDER — PHENYLEPHRINE 40 MCG/ML (10ML) SYRINGE FOR IV PUSH (FOR BLOOD PRESSURE SUPPORT)
80.0000 ug | PREFILLED_SYRINGE | INTRAVENOUS | Status: DC | PRN
  Filled 2013-06-13: qty 2

## 2013-06-13 MED ORDER — PHENYLEPHRINE 40 MCG/ML (10ML) SYRINGE FOR IV PUSH (FOR BLOOD PRESSURE SUPPORT)
80.0000 ug | PREFILLED_SYRINGE | INTRAVENOUS | Status: DC | PRN
  Filled 2013-06-13: qty 2
  Filled 2013-06-13: qty 5

## 2013-06-13 MED ORDER — LACTATED RINGERS IV SOLN: 500.0000 mL | INTRAVENOUS | Status: DC | PRN

## 2013-06-13 MED ORDER — EPHEDRINE 5 MG/ML INJ
10.0000 mg | INTRAVENOUS | Status: DC | PRN
  Filled 2013-06-13: qty 2
  Filled 2013-06-13: qty 4

## 2013-06-13 MED ORDER — OXYTOCIN 40 UNITS IN LACTATED RINGERS INFUSION - SIMPLE MED: 62.5000 mL/h | INTRAVENOUS | Status: DC

## 2013-06-13 MED ORDER — NALBUPHINE SYRINGE 5 MG/0.5 ML
10.0000 mg | INJECTION | INTRAMUSCULAR | Status: DC | PRN
  Administered 2013-06-14 (×2): 10 mg via INTRAVENOUS
  Filled 2013-06-13 (×3): qty 1

## 2013-06-13 MED ORDER — LACTATED RINGERS IV SOLN
INTRAVENOUS | Status: DC
  Administered 2013-06-13 – 2013-06-14 (×2): via INTRAVENOUS
  Administered 2013-06-14: 1000 mL via INTRAVENOUS
  Administered 2013-06-14 – 2013-06-15 (×3): via INTRAVENOUS

## 2013-06-13 MED ORDER — ACETAMINOPHEN 325 MG PO TABS
650.0000 mg | ORAL_TABLET | ORAL | Status: DC | PRN
  Administered 2013-06-14: 650 mg via ORAL
  Filled 2013-06-13: qty 2

## 2013-06-13 MED ORDER — MISOPROSTOL 25 MCG QUARTER TABLET
25.0000 ug | ORAL_TABLET | ORAL | Status: DC | PRN
  Administered 2013-06-14 (×2): 25 ug via VAGINAL
  Filled 2013-06-13: qty 1
  Filled 2013-06-13 (×2): qty 0.25

## 2013-06-13 MED ORDER — EPHEDRINE 5 MG/ML INJ
10.0000 mg | INTRAVENOUS | Status: DC | PRN
  Filled 2013-06-13: qty 2

## 2013-06-13 MED ORDER — CITRIC ACID-SODIUM CITRATE 334-500 MG/5ML PO SOLN
30.0000 mL | ORAL | Status: DC | PRN
  Administered 2013-06-14: 30 mL via ORAL
  Filled 2013-06-13: qty 15

## 2013-06-13 MED ORDER — FENTANYL 2.5 MCG/ML BUPIVACAINE 1/10 % EPIDURAL INFUSION (WH - ANES)
14.0000 mL/h | INTRAMUSCULAR | Status: DC | PRN
  Administered 2013-06-15 (×2): 14 mL/h via EPIDURAL
  Filled 2013-06-13 (×3): qty 125

## 2013-06-13 MED ORDER — LIDOCAINE HCL (PF) 1 % IJ SOLN
30.0000 mL | INTRAMUSCULAR | Status: AC | PRN
  Administered 2013-06-15: 30 mL via SUBCUTANEOUS
  Filled 2013-06-13 (×3): qty 30

## 2013-06-13 NOTE — Progress Notes (Signed)
Audrey Ranger, MD, at bedside. Pt to eat a light laboring diet then cytotec to be placed an hour afterwards.

## 2013-06-13 NOTE — Progress Notes (Signed)
Pulse- 119   Pain-right side with standing a long time  Edema-feet

## 2013-06-13 NOTE — Progress Notes (Signed)
Pt here without complaints BP elevated to 142/93  Today with 100 protein on urine dip Only intermittent contractions lately Denies Headache and RUQ pain, some Right abd pain with standing for a long time Will send to MAU for pre-eclampsia evaluation and induction if necessary

## 2013-06-13 NOTE — MAU Note (Signed)
Pt sent from clinic for BP evaluation. 

## 2013-06-13 NOTE — Patient Instructions (Signed)
PLease go to the MAU for evaluation for possible pre-eclampsia  Preeclampsia and Eclampsia Preeclampsia is a condition of high blood pressure during pregnancy. It can happen at 20 weeks or later in pregnancy. If high blood pressure occurs in the second half of pregnancy with no other symptoms, it is called gestational hypertension and goes away after the baby is born. If any of the symptoms listed below develop with gestational hypertension, it is then called preeclampsia. Eclampsia (convulsions) may follow preeclampsia. This is one of the reasons for regular prenatal checkups. Early diagnosis and treatment are very important to prevent eclampsia. CAUSES  There is no known cause of preeclampsia/eclampsia in pregnancy. There are several known conditions that may put the pregnant woman at risk, such as:  The first pregnancy.  Having preeclampsia in a past pregnancy.  Having lasting (chronic) high blood pressure.  Having multiples (twins, triplets).  Being age 73 or older.  African American ethnic background.  Having kidney disease or diabetes.  Medical conditions such as lupus or blood diseases.  Being overweight (obese). SYMPTOMS   High blood pressure.  Headaches.  Sudden weight gain.  Swelling of hands, face, legs, and feet.  Protein in the urine.  Feeling sick to your stomach (nauseous) and throwing up (vomiting).  Vision problems (blurred or double vision).  Numbness in the face, arms, legs, and feet.  Dizziness.  Slurred speech.  Preeclampsia can cause growth retardation in the fetus.  Separation (abruption) of the placenta.  Not enough fluid in the amniotic sac (oligohydramnios).  Sensitivity to bright lights.  Belly (abdominal) pain. DIAGNOSIS  If protein is found in the urine in the second half of pregnancy, this is considered preeclampsia. Other symptoms mentioned above may also be present. TREATMENT  It is necessary to treat this.  Your caregiver  may prescribe bed rest early in this condition. Plenty of rest and salt restriction may be all that is needed.  Medicines may be necessary to lower blood pressure if the condition does not respond to more conservative measures.  In more severe cases, hospitalization may be needed:  For treatment of blood pressure.  To control fluid retention.  To monitor the baby to see if the condition is causing harm to the baby.  Hospitalization is the best way to treat the first sign of preeclampsia. This is so the mother and baby can be watched closely and blood tests can be done effectively and correctly.  If the condition becomes severe, it may be necessary to induce labor or to remove the infant by surgical means (cesarean section). The best cure for preeclampsia/eclampsia is to deliver the baby. Preeclampsia and eclampsia involve risks to mother and infant. Your caregiver will discuss these risks with you. Together, you can work out the best possible approach to your problems. Make sure you keep your prenatal visits as scheduled. Not keeping appointments could result in a chronic or permanent injury, pain, disability to you, and death or injury to you or your unborn baby. If there is any problem keeping the appointment, you must call to reschedule. HOME CARE INSTRUCTIONS   Keep your prenatal appointments and tests as scheduled.  Tell your caregiver if you have any of the above risk factors.  Get plenty of rest and sleep.  Eat a balanced diet that is low in salt, and do not add salt to your food.  Avoid stressful situations.  Only take over-the-counter and prescriptions medicines for pain, discomfort, or fever as directed by your caregiver.  SEEK IMMEDIATE MEDICAL CARE IF:   You develop severe swelling anywhere in the body. This usually occurs in the legs.  You gain 5 lb/2.3 kg or more in a week.  You develop a severe headache, dizziness, problems with your vision, or confusion.  You have  abdominal pain, nausea, or vomiting.  You have a seizure.  You have trouble moving any part of your body, or you develop numbness or problems speaking.  You have bruising or abnormal bleeding from anywhere in the body.  You develop a stiff neck.  You pass out. MAKE SURE YOU:   Understand these instructions.  Will watch your condition.  Will get help right away if you are not doing well or get worse. Document Released: 11/26/2000 Document Revised: 02/21/2012 Document Reviewed: 07/12/2008 [facility] Patient Information 2014 [place], [place].

## 2013-06-13 NOTE — H&P (Signed)
Audrey Collins is a 22 y.o. female presenting for gestational hypertension. History  21yo G1P0 at [redacted]w[redacted]d presents today from the office for elevated BP (140s/90s). Complains of mild pressure-like headache off and on for last few weeks, along with occasional vision changes (flashes). Denies contractions, fluid loss or bleeding. Baby moving well.  Receives care at [facility]. Has had borderline BP recently - 130s/high 80s - along with proteinuria (1+) on several occasions. 24-hour urine protein < 200. Today in clinic, 2+ proteinuria. No other complications of this pregnancy.   OB History   Grav Para Term Preterm Abortions TAB SAB Ect Mult Living   1              Past Medical History  Diagnosis Date  . Placenta previa marginalis     resolved per pt  . Anxiety   . Short cervix affecting pregnancy     took prometrium, resolved  . Chlamydia infection complicating pregnancy    Past Surgical History  Procedure Laterality Date  . No past surgeries     Family History: family history includes Diabetes in her maternal grandfather and paternal grandfather; Hypertension in her father; and Thyroid disease in her mother. Social History:  reports that she has quit smoking. She has never used smokeless tobacco. She reports that she does not drink alcohol or use illicit drugs.   Prenatal Transfer Tool  Maternal Diabetes: No Genetic Screening: Normal Maternal Ultrasounds/Referrals: Normal Fetal Ultrasounds or other Referrals:  Fetal echo Maternal Substance Abuse:  No Significant Maternal Medications:  None Significant Maternal Lab Results:  None Other Comments:  None  ROS negative except as above    Blood pressure 138/98, pulse 125, temperature 97.5 F (36.4 C), last menstrual period 09/12/2012. BP 135-157/90-103 in MAU  Exam Physical Exam General appearance: alert and no distress Head: Normocephalic, without obvious abnormality, atraumatic Lungs: clear to auscultation  bilaterally Heart: regular rate and rhythm and S1, S2 normal Abdomen: gravid, non-tender  Dilation: 4 Effacement (%): 60 Cervical Position: Posterior Station: -3 Presentation: Vertex Exam by:: l.poore, rn  Results for orders placed during the hospital encounter of 06/13/13 (from the past 24 hour(s))  COMPREHENSIVE METABOLIC PANEL     Status: Abnormal   Collection Time    06/13/13  3:45 PM      Result Value Range   Sodium 135  135 - 145 mEq/L   Potassium 3.7  3.5 - 5.1 mEq/L   Chloride 102  96 - 112 mEq/L   CO2 20  19 - 32 mEq/L   Glucose, Bld 135 (*) 70 - 99 mg/dL   BUN 6  6 - 23 mg/dL   Creatinine, Ser 6.44 (*) 0.50 - 1.10 mg/dL   Calcium 9.6  8.4 - 03.4 mg/dL   Total Protein 6.3  6.0 - 8.3 g/dL   Albumin 2.6 (*) 3.5 - 5.2 g/dL   AST 12  0 - 37 U/L   ALT 8  0 - 35 U/L   Alkaline Phosphatase 205 (*) 39 - 117 U/L   Total Bilirubin 0.3  0.3 - 1.2 mg/dL   GFR calc non Af Amer >90  >90 mL/min   GFR calc Af Amer >90  >90 mL/min  CBC     Status: Abnormal   Collection Time    06/13/13  3:45 PM      Result Value Range   WBC 9.4  4.0 - 10.5 K/uL   RBC 3.97  3.87 - 5.11 MIL/uL  Hemoglobin 11.4 (*) 12.0 - 15.0 g/dL   HCT 96.0 (*) 45.4 - 09.8 %   MCV 87.9  78.0 - 100.0 fL   MCH 28.7  26.0 - 34.0 pg   MCHC 32.7  30.0 - 36.0 g/dL   RDW 11.9  14.7 - 82.9 %   Platelets 160  150 - 400 K/uL  RPR     Status: None   Collection Time    06/13/13  3:45 PM      Result Value Range   RPR NON REACTIVE  NON REACTIVE  TYPE AND SCREEN     Status: None   Collection Time    06/13/13  3:45 PM      Result Value Range   ABO/RH(D) O POS     Antibody Screen NEG     Sample Expiration 06/16/2013    ABO/RH     Status: None   Collection Time    06/13/13  3:45 PM      Result Value Range   ABO/RH(D) O POS    PROTEIN / CREATININE RATIO, URINE     Status: None   Collection Time    06/13/13  4:30 PM      Result Value Range   Creatinine, Urine 333.30     Total Protein, Urine 42.7     PROTEIN  CREATININE RATIO 0.13  0.00 - 0.15      Prenatal labs: ABO, Rh: O/POS/-- (01/23 0947) Antibody: NEG (01/23 0947) Rubella: 1.33 (01/23 0947) RPR: NON REAC (04/23 1100)  HBsAg: NEGATIVE (01/23 0947)  HIV: NON REACTIVE (04/23 1100)  GBS:   Positive  Assessment/Plan: -Admit for induction for gestational HTN, preeclampsia work-up negative, no magnesium for now -Pitocin -Penicillin -Monitor for severe pressures or other severe features   Tawni Carnes 06/13/2013, 4:46 PM  I saw and examined patient and agree with above resident note. I reviewed history, imaging, labs, and vitals. I personally reviewed the fetal heart tracing, and it is reactive. Napoleon Form, MD

## 2013-06-13 NOTE — Progress Notes (Signed)
Occasional floaters

## 2013-06-13 NOTE — Progress Notes (Signed)
Pt states she feels "pressure in my face"

## 2013-06-14 ENCOUNTER — Encounter (HOSPITAL_COMMUNITY): Payer: Self-pay

## 2013-06-14 MED ORDER — OXYTOCIN 40 UNITS IN LACTATED RINGERS INFUSION - SIMPLE MED
1.0000 m[IU]/min | INTRAVENOUS | Status: DC
  Administered 2013-06-14: 4 m[IU]/min via INTRAVENOUS

## 2013-06-14 MED ORDER — OXYTOCIN 40 UNITS IN LACTATED RINGERS INFUSION - SIMPLE MED: 1.0000 m[IU]/min | INTRAVENOUS | Status: DC

## 2013-06-14 MED ORDER — FENTANYL CITRATE 0.05 MG/ML IJ SOLN
100.0000 ug | INTRAMUSCULAR | Status: DC | PRN
  Administered 2013-06-15 (×3): 100 ug via INTRAVENOUS
  Filled 2013-06-14 (×3): qty 2

## 2013-06-14 MED ORDER — ZOLPIDEM TARTRATE 5 MG PO TABS
5.0000 mg | ORAL_TABLET | Freq: Once | ORAL | Status: AC
  Administered 2013-06-14: 5 mg via ORAL
  Filled 2013-06-14: qty 1

## 2013-06-14 MED ORDER — FENTANYL CITRATE 0.05 MG/ML IJ SOLN
INTRAMUSCULAR | Status: AC
  Administered 2013-06-14: 100 ug
  Filled 2013-06-14: qty 2

## 2013-06-14 NOTE — H&P (Signed)
Attestation of Attending Supervision of Advanced Practitioner (CNM/NP): Evaluation and management procedures were performed by the Advanced Practitioner under my supervision and collaboration.  I have reviewed the Advanced Practitioner's note and chart, and I agree with the management and plan.  Fedora Knisely 06/14/2013 3:02 PM

## 2013-06-14 NOTE — Progress Notes (Signed)
Audrey Collins is a 22 y.o. G1P0 at [redacted]w[redacted]d admitted for IOL for GHTN Subjective: Patient comfortable  Objective: BP 130/79  Pulse 85  Temp(Src) 97.1 F (36.2 C) (Axillary)  Resp 20  Ht 5\' 10"  (1.778 m)  Wt 125.193 kg (276 lb)  BMI 39.6 kg/m2  SpO2 100%  LMP 09/12/2012      FHT:  FHR: 140 bpm, variability: moderate,  accelerations:  Present,  decelerations:  Absent UC:   irregular, every ? Minutes, difficult to monitor SVE:   Dilation: 2.5 Effacement (%): 50 Station: -2 Exam by:: Anyanwu  Labs: Lab Results  Component Value Date   WBC 9.4 06/13/2013   HGB 11.4* 06/13/2013   HCT 34.9* 06/13/2013   MCV 87.9 06/13/2013   PLT 160 06/13/2013    Assessment / Plan: Induction of labor due to gestational hypertension. Pitocin stopped. Cytotec placed.  Labor: progressing slowly, pitocin stopped and cytotec placed Preeclampsia:  labs stable Fetal Wellbeing:  Category I Pain Control:  Labor support without medications I/D:  n/a Anticipated MOD:  NSVD  Tawni Carnes 06/14/2013, 2:50 PM

## 2013-06-14 NOTE — Progress Notes (Signed)
Patient ID: ALMAS RAKE, female   DOB: 1991/04/15, 22 y.o.   MRN: [id number]   S: Pt comfortable, not contracting  O:   Filed Vitals:   06/14/13 1855 06/14/13 2051 06/14/13 2150 06/14/13 2313  BP: 129/103 121/80 123/81 126/71  Pulse: 99 109 105 103  Temp:   97.9 F (36.6 C)   TempSrc:   Oral   Resp: 20 18 18 18   Height:      Weight:      SpO2:        Cerv:  4/70/-2, soft, anterior  Foley bulb placed easily with 80 ml LR  FHTs:  135, mod var, accels present, no decels TOCO:  q  5-10 min  A/P 22 y.o. G1P0 at [redacted]w[redacted]d here for IOL for GHTN - BP controlled - s/p 2 doses of cytotec - cervix much more anterior than previously - FB placed, will start low dose pitocin, anticipate FB falling out soon  Napoleon Form, MD

## 2013-06-14 NOTE — Progress Notes (Signed)
Audrey Collins is a 21 y.o. G1P0 at [redacted]w[redacted]d admitted for IOL for GHTN  Subjective: Patient comfortable, some pain with contractions  Objective: BP 129/103  Pulse 99  Temp(Src) 98.1 F (36.7 C) (Oral)  Resp 20  Ht 5\' 10"  (1.778 m)  Wt 125.193 kg (276 lb)  BMI 39.6 kg/m2  SpO2 100%  LMP 09/12/2012      FHT:  FHR: 125 bpm, variability: moderate,  accelerations:  Present,  decelerations:  Absent UC:   irregular, every 5 minutes SVE:   Dilation: 3 Effacement (%): 50 Station: -2 Exam by:: Sowder,RNC  Labs: Lab Results  Component Value Date   WBC 9.4 06/13/2013   HGB 11.4* 06/13/2013   HCT 34.9* 06/13/2013   MCV 87.9 06/13/2013   PLT 160 06/13/2013    Assessment / Plan: Induction of labor for gestational hypertension, pitocin stopped, second cytotec placed  Labor: not in active labor, undergoing cervical ripening Preeclampsia:  labs stable Fetal Wellbeing:  Category I Pain Control:  Labor support without medications I/D:  Pencillin prophylaxis Anticipated MOD:  NSVD  Tawni Carnes 06/14/2013, 7:17 PM

## 2013-06-15 ENCOUNTER — Encounter (HOSPITAL_COMMUNITY): Payer: Self-pay | Admitting: Anesthesiology

## 2013-06-15 ENCOUNTER — Inpatient Hospital Stay (HOSPITAL_COMMUNITY): Payer: Medicaid Other | Admitting: Anesthesiology

## 2013-06-15 DIAGNOSIS — O26899 Other specified pregnancy related conditions, unspecified trimester: Secondary | ICD-10-CM

## 2013-06-15 DIAGNOSIS — O139 Gestational [pregnancy-induced] hypertension without significant proteinuria, unspecified trimester: Secondary | ICD-10-CM

## 2013-06-15 LAB — COMPREHENSIVE METABOLIC PANEL
AST: 21 U/L (ref 0–37)
Albumin: 2.2 g/dL — ABNORMAL LOW (ref 3.5–5.2)
BUN: 7 mg/dL (ref 6–23)
Calcium: 9.3 mg/dL (ref 8.4–10.5)
Creatinine, Ser: 0.76 mg/dL (ref 0.50–1.10)
GFR calc non Af Amer: >90 mL/min (ref >90)
Total Bilirubin: 0.7 mg/dL (ref 0.3–1.2)

## 2013-06-15 LAB — CBC
HCT: 30.9 % — ABNORMAL LOW (ref 36.0–46.0)
HCT: 30.4 % — ABNORMAL LOW (ref 36.0–46.0)
Hemoglobin: 10.3 g/dL — ABNORMAL LOW (ref 12.0–15.0)
MCH: 29.2 pg (ref 26.0–34.0)
MCH: 29.5 pg (ref 26.0–34.0)
MCH: 29 pg (ref 26.0–34.0)
MCV: 88.5 fL (ref 78.0–100.0)
MCV: 87.4 fL (ref 78.0–100.0)
Platelets: 146 10*3/uL — ABNORMAL LOW (ref 150–400)
Platelets: 145 10*3/uL — ABNORMAL LOW (ref 150–400)
RBC: 3.67 MIL/uL — ABNORMAL LOW (ref 3.87–5.11)
RBC: 3.49 MIL/uL — ABNORMAL LOW (ref 3.87–5.11)
RDW: 14.5 % (ref 11.5–15.5)

## 2013-06-15 MED ORDER — DIPHENHYDRAMINE HCL 25 MG PO CAPS: 25.0000 mg | ORAL_CAPSULE | Freq: Four times a day (QID) | ORAL | Status: DC | PRN

## 2013-06-15 MED ORDER — LIDOCAINE HCL (PF) 1 % IJ SOLN
INTRAMUSCULAR | Status: DC | PRN
  Administered 2013-06-15 (×2): 8 mL

## 2013-06-15 MED ORDER — ONDANSETRON HCL 4 MG/2ML IJ SOLN: 4.0000 mg | INTRAMUSCULAR | Status: DC | PRN

## 2013-06-15 MED ORDER — PRENATAL MULTIVITAMIN CH
1.0000 | ORAL_TABLET | Freq: Every day | ORAL | Status: DC
  Administered 2013-06-16 – 2013-06-18 (×3): 1 via ORAL
  Filled 2013-06-15 (×3): qty 1

## 2013-06-15 MED ORDER — ONDANSETRON HCL 4 MG PO TABS: 4.0000 mg | ORAL_TABLET | ORAL | Status: DC | PRN

## 2013-06-15 MED ORDER — SIMETHICONE 80 MG PO CHEW: 80.0000 mg | CHEWABLE_TABLET | ORAL | Status: DC | PRN

## 2013-06-15 MED ORDER — MISOPROSTOL 200 MCG PO TABS: 1000.0000 ug | ORAL_TABLET | Freq: Once | ORAL | Status: DC

## 2013-06-15 MED ORDER — ZOLPIDEM TARTRATE 5 MG PO TABS: 5.0000 mg | ORAL_TABLET | Freq: Every evening | ORAL | Status: DC | PRN

## 2013-06-15 MED ORDER — SENNOSIDES-DOCUSATE SODIUM 8.6-50 MG PO TABS
2.0000 | ORAL_TABLET | Freq: Every day | ORAL | Status: DC
  Administered 2013-06-15 – 2013-06-17 (×3): 2 via ORAL

## 2013-06-15 MED ORDER — WITCH HAZEL-GLYCERIN EX PADS: 1.0000 "application " | MEDICATED_PAD | CUTANEOUS | Status: DC | PRN

## 2013-06-15 MED ORDER — FENTANYL 2.5 MCG/ML BUPIVACAINE 1/10 % EPIDURAL INFUSION (WH - ANES)
INTRAMUSCULAR | Status: DC | PRN
Start: 2013-06-15 — End: 2013-06-15
  Administered 2013-06-15: 14 mL/h via EPIDURAL

## 2013-06-15 MED ORDER — IBUPROFEN 600 MG PO TABS
600.0000 mg | ORAL_TABLET | Freq: Four times a day (QID) | ORAL | Status: DC
  Administered 2013-06-16 – 2013-06-18 (×11): 600 mg via ORAL
  Filled 2013-06-15 (×11): qty 1

## 2013-06-15 MED ORDER — BENZOCAINE-MENTHOL 20-0.5 % EX AERO
1.0000 "application " | INHALATION_SPRAY | CUTANEOUS | Status: DC | PRN
  Filled 2013-06-15 (×2): qty 56

## 2013-06-15 MED ORDER — DIBUCAINE 1 % RE OINT: 1.0000 "application " | TOPICAL_OINTMENT | RECTAL | Status: DC | PRN

## 2013-06-15 MED ORDER — LANOLIN HYDROUS EX OINT: TOPICAL_OINTMENT | CUTANEOUS | Status: DC | PRN

## 2013-06-15 MED ORDER — OXYCODONE-ACETAMINOPHEN 5-325 MG PO TABS
1.0000 | ORAL_TABLET | ORAL | Status: DC | PRN
  Administered 2013-06-15: 1 via ORAL
  Administered 2013-06-16 (×4): 2 via ORAL
  Filled 2013-06-15: qty 2
  Filled 2013-06-15: qty 1
  Filled 2013-06-15 (×3): qty 2

## 2013-06-15 MED ORDER — TETANUS-DIPHTH-ACELL PERTUSSIS 5-2.5-18.5 LF-MCG/0.5 IM SUSP
0.5000 mL | Freq: Once | INTRAMUSCULAR | Status: AC
  Administered 2013-06-16: 0.5 mL via INTRAMUSCULAR
  Filled 2013-06-15: qty 0.5

## 2013-06-15 MED ORDER — MISOPROSTOL 200 MCG PO TABS
ORAL_TABLET | ORAL | Status: AC
  Administered 2013-06-15: 1000 ug
  Filled 2013-06-15: qty 5

## 2013-06-15 MED ORDER — OXYTOCIN 40 UNITS IN LACTATED RINGERS INFUSION - SIMPLE MED: 1.0000 m[IU]/min | INTRAVENOUS | Status: DC

## 2013-06-15 NOTE — Progress Notes (Signed)
Audrey Collins is a 22 y.o. G1P0 at [redacted]w[redacted]d with gestational hypertension Subjective:   Objective: BP 137/90  Pulse 98  Temp(Src) 98 F (36.7 C) (Oral)  Resp 18  Ht 5\' 10"  (1.778 m)  Wt 276 lb (125.193 kg)  BMI 39.6 kg/m2  SpO2 99%  LMP 09/12/2012 I/O last 3 completed shifts: In: -  Out: 375 [Urine:375]    FHT:  FHR: 140 bpm, variability: moderate,  accelerations:  Present,  decelerations:  Absent UC:   Unable to tell.  IUPC just placed SVE:  6/70/-3 Labs: Lab Results  Component Value Date   WBC 8.0 06/15/2013   HGB 10.7* 06/15/2013   HCT 32.5* 06/15/2013   MCV 88.6 06/15/2013   PLT 146* 06/15/2013    Assessment / Plan: Induction of labor for GHTN AROM IUPC Continue Pitocin   Beatris Belen H. 06/15/2013, 9:19 AM

## 2013-06-15 NOTE — Anesthesia Procedure Notes (Signed)
Epidural Patient location during procedure: OB Start time: 06/15/2013 2:30 AM End time: 06/15/2013 2:40 AM  Staffing Anesthesiologist: Sandrea Hughs Performed by: anesthesiologist   Preanesthetic Checklist Completed: patient identified, surgical consent, pre-op evaluation, timeout performed, IV checked, risks and benefits discussed and monitors and equipment checked  Epidural Patient position: sitting Prep: site prepped and draped and DuraPrep Patient monitoring: continuous pulse ox and blood pressure Approach: midline Injection technique: LOR air  Needle:  Needle type: Tuohy  Needle gauge: 17 G Needle length: 9 cm and 9 Needle insertion depth: 9 cm Catheter type: closed end flexible Catheter size: 19 Gauge Catheter at skin depth: 15 cm Test dose: negative and Other  Assessment Sensory level: T9 Events: blood not aspirated, injection not painful, no injection resistance, negative IV test and no paresthesia  Additional Notes Reason for block:procedure for pain

## 2013-06-15 NOTE — Anesthesia Preprocedure Evaluation (Signed)
Anesthesia Evaluation  Patient identified by MRN, date of birth, ID band Patient awake    Reviewed: Allergy & Precautions, H&P , NPO status   Airway Mallampati: II TM Distance: >3 FB Neck ROM: full    Dental no notable dental hx.    Pulmonary neg pulmonary ROS,    Pulmonary exam normal       Cardiovascular hypertension,     Neuro/Psych negative neurological ROS     GI/Hepatic negative GI ROS, Neg liver ROS,   Endo/Other  Morbid obesity  Renal/GU negative Renal ROS     Musculoskeletal negative musculoskeletal ROS (+)   Abdominal (+) + obese,   Peds negative pediatric ROS (+)  Hematology negative hematology ROS (+)   Anesthesia Other Findings   Reproductive/Obstetrics (+) Pregnancy                           Anesthesia Physical Anesthesia Plan  ASA: III  Anesthesia Plan: Epidural   Post-op Pain Management:    Induction:   Airway Management Planned:   Additional Equipment:   Intra-op Plan:   Post-operative Plan:   Informed Consent: I have reviewed the patients History and Physical, chart, labs and discussed the procedure including the risks, benefits and alternatives for the proposed anesthesia with the patient or authorized representative who has indicated his/her understanding and acceptance.     Plan Discussed with:   Anesthesia Plan Comments:         Anesthesia Quick Evaluation

## 2013-06-15 NOTE — Progress Notes (Signed)
Audrey Collins is a 22 y.o. G1P0 at [redacted]w[redacted]d admitted for induction of labor due to Hypertension.  Subjective: Patient comfortable with epidural  Objective: BP 137/90  Pulse 98  Temp(Src) 98 F (36.7 C) (Oral)  Resp 18  Ht 5\' 10"  (1.778 m)  Wt 125.193 kg (276 lb)  BMI 39.6 kg/m2  SpO2 99%  LMP 09/12/2012 I/O last 3 completed shifts: In: -  Out: 375 [Urine:375]    FHT:  FHR: 140 bpm, variability: moderate,  accelerations:  Present,  decelerations:  Absent UC:   regular, every 2-3 minutes, tough to read, on 12mu of pit SVE:   Dilation: 6 Effacement (%): 70 Station: -1 Exam by:: l.poore, rn AROM clear fluid  Labs: Lab Results  Component Value Date   WBC 8.0 06/15/2013   HGB 10.7* 06/15/2013   HCT 32.5* 06/15/2013   MCV 88.6 06/15/2013   PLT 146* 06/15/2013    Assessment / Plan: Blood pressure parameters stable Active labor with slow progress. AROM  Labor: progress is slow, ? adequate labor, AROM Preeclampsia:  labs stable Fetal Wellbeing:  Category I Pain Control:  Epidural I/D:  n/a Anticipated MOD:  NSVD  Tawni Carnes 06/15/2013, 9:08 AM  Evaluation and management procedures were performed by Resident physician under my supervision/collaboration. Chart reviewed, patient examined by me and I agree with management and plan. Danae Orleans, CNM 06/15/2013 11:58 AM

## 2013-06-15 NOTE — Progress Notes (Signed)
Artelia Laroche, CNM made aware of increased BP's; will continue to watch; parameters of 160/100 given in order to notify provider.

## 2013-06-15 NOTE — Progress Notes (Signed)
Audrey Collins is a 22 y.o. G1P0 at [redacted]w[redacted]d   Subjective: Feels ctx pain  Objective: BP 134/90  Pulse 88  Temp(Src) 98 F (36.7 C) (Oral)  Resp 18  Ht 5\' 10"  (1.778 m)  Wt 276 lb (125.193 kg)  BMI 39.6 kg/m2  SpO2 99%  LMP 09/12/2012 I/O last 3 completed shifts: In: -  Out: 375 [Urine:375]    FHT:  120s', moderate variability, no decels, + accels UC:   regular, every 2 minutes (adequate) SVE:   8/90/-1 to -2 Labs: Lab Results  Component Value Date   WBC 8.0 06/15/2013   HGB 10.7* 06/15/2013   HCT 32.5* 06/15/2013   MCV 88.6 06/15/2013   PLT 146* 06/15/2013    Assessment / Plan: Making progress on pitocin    Kobi Aller H. 06/15/2013, 11:23 AM

## 2013-06-16 LAB — CBC
HCT: 27.1 % — ABNORMAL LOW (ref 36.0–46.0)
MCH: 29.4 pg (ref 26.0–34.0)
MCHC: 33.6 g/dL (ref 30.0–36.0)
MCV: 87.7 fL (ref 78.0–100.0)
RDW: 14.5 % (ref 11.5–15.5)

## 2013-06-16 LAB — PROTEIN / CREATININE RATIO, URINE
Creatinine, Urine: 114.5 mg/dL
Protein Creatinine Ratio: 0.75 — ABNORMAL HIGH (ref 0.00–0.15)
Total Protein, Urine: 86.4 mg/dL

## 2013-06-16 MED ORDER — LACTATED RINGERS IV SOLN
INTRAVENOUS | Status: DC
  Administered 2013-06-16 – 2013-06-17 (×3): via INTRAVENOUS

## 2013-06-16 MED ORDER — MAGNESIUM SULFATE BOLUS VIA INFUSION
4.0000 g | Freq: Once | INTRAVENOUS | Status: AC
  Administered 2013-06-16: 4 g via INTRAVENOUS
  Filled 2013-06-16: qty 500

## 2013-06-16 MED ORDER — MAGNESIUM SULFATE 40 G IN LACTATED RINGERS - SIMPLE
2.0000 g/h | INTRAVENOUS | Status: DC
  Administered 2013-06-17: 2 g/h via INTRAVENOUS
  Filled 2013-06-16 (×2): qty 500

## 2013-06-16 NOTE — Progress Notes (Signed)
Patient ID: Audrey Collins, female   DOB: 14-Dec-1990, 22 y.o.   MRN: [id number]  S/O:  Feels OK except for upper thigh pain, unrelieved by pain meds          Denies headache or visual changes           AVSS Filed Vitals:   06/15/13 2116 06/15/13 2140 06/15/13 2251 06/16/13 0300  BP: 168/88 142/96 142/84 133/85  Pulse: 99 100 111 400  Temp:  98.2 F (36.8 C) 98.4 F (36.9 C) 98.2 F (36.8 C)  TempSrc:  Oral Oral Oral  Resp: 18 20 16 20   Height:      Weight:      SpO2:       DTRs brisk with one beat of clonus  Extremities 1+ edema  FF, lochia normal  Results for orders placed during the hospital encounter of 06/13/13 (from the past 24 hour(s))  CBC     Status: Abnormal   Collection Time    06/15/13  8:20 PM      Result Value Range   WBC 18.8 (*) 4.0 - 10.5 K/uL   RBC 3.49 (*) 3.87 - 5.11 MIL/uL   Hemoglobin 10.3 (*) 12.0 - 15.0 g/dL   HCT 40.9 (*) 81.1 - 91.4 %   MCV 88.5  78.0 - 100.0 fL   MCH 29.5  26.0 - 34.0 pg   MCHC 33.3  30.0 - 36.0 g/dL   RDW 78.2  95.6 - 21.3 %   Platelets 152  150 - 400 K/uL  CBC     Status: Abnormal   Collection Time    06/15/13  9:55 PM      Result Value Range   WBC 19.4 (*) 4.0 - 10.5 K/uL   RBC 3.48 (*) 3.87 - 5.11 MIL/uL   Hemoglobin 10.1 (*) 12.0 - 15.0 g/dL   HCT 08.6 (*) 57.8 - 46.9 %   MCV 87.4  78.0 - 100.0 fL   MCH 29.0  26.0 - 34.0 pg   MCHC 33.2  30.0 - 36.0 g/dL   RDW 62.9  52.8 - 41.3 %   Platelets 145 (*) 150 - 400 K/uL  COMPREHENSIVE METABOLIC PANEL     Status: Abnormal   Collection Time    06/15/13  9:55 PM      Result Value Range   Sodium 131 (*) 135 - 145 mEq/L   Potassium 3.8  3.5 - 5.1 mEq/L   Chloride 99  96 - 112 mEq/L   CO2 21  19 - 32 mEq/L   Glucose, Bld 104 (*) 70 - 99 mg/dL   BUN 7  6 - 23 mg/dL   Creatinine, Ser 2.44  0.50 - 1.10 mg/dL   Calcium 9.3  8.4 - 01.0 mg/dL   Total Protein 5.4 (*) 6.0 - 8.3 g/dL   Albumin 2.2 (*) 3.5 - 5.2 g/dL   AST 21  0 - 37 U/L   ALT 9  0 - 35 U/L   Alkaline  Phosphatase 184 (*) 39 - 117 U/L   Total Bilirubin 0.7  0.3 - 1.2 mg/dL   GFR calc non Af Amer >90  >90 mL/min   GFR calc Af Amer >90  >90 mL/min  PROTEIN / CREATININE RATIO, URINE     Status: Abnormal   Collection Time    06/15/13 11:45 PM      Result Value Range   Creatinine, Urine 114.50     Total Protein, Urine 86.4  PROTEIN CREATININE RATIO 0.75 (*) 0.00 - 0.15   This represents a significant increase in Pr/Cr from 2 days ago.  Discussed with Dr Penne Lash  Will start on Magnesium Sulfate and transfer to AICU  Discussed with patient who is agreeable.

## 2013-06-16 NOTE — Anesthesia Postprocedure Evaluation (Signed)
Anesthesia Post Note  Patient: Audrey Collins  Procedure(s) Performed: * No procedures listed *  Anesthesia type: Epidural  Patient location: Mother/Baby  Post pain: Pain level controlled  Post assessment: Post-op Vital signs reviewed  Last Vitals:  Filed Vitals:   06/16/13 0800  BP: 122/83  Pulse: 105  Temp:   Resp: 20    Post vital signs: Reviewed  Level of consciousness:alert  Complications: No apparent anesthesia complications

## 2013-06-16 NOTE — Progress Notes (Signed)
Post Partum Day 1 Subjective: no complaints, voiding, tolerating PO and No signs of preeclampsia--no RUQ pain, scotomata, headache  Objective: Blood pressure 122/83, pulse 105, temperature 98.4 F (36.9 C), temperature source Oral, resp. rate 20, height 5\' 9"  (1.753 m), weight 271 lb 4.8 oz (123.061 kg), last menstrual period 09/12/2012, SpO2 97.00%, unknown if currently breastfeeding.  Physical Exam:  General: alert, cooperative and no distress Lochia: appropriate Uterine Fundus: firm Incision: n/a DVT Evaluation: No evidence of DVT seen on physical exam. Negative Homan's sign. No cords or calf tenderness.   Recent Labs  06/15/13 2155 06/16/13 0510  HGB 10.1* 9.1*  HCT 30.4* 27.1*    Assessment/Plan: Plan for discharge tomorrow and Breastfeeding Continue Magnesium sulfate for 24 hours total.   LOS: 3 days   Audrey Collins H. 06/16/2013, 8:11 AM

## 2013-06-16 NOTE — Progress Notes (Signed)
Pt is requiring significant coaching to get OOB or even move in bed. Pt reports her pain at "9"/10 ("sore") in her anterior thighs (bilaterally) and lower back.  Pt also reports the Percocet has not changed her pain and was curious if IV medication would be better. Genella Rife, CNM made aware. Abdomen soft and only tender with fundal checks. Fundus U/1, firm and small lochia noted. 1+ BLE edema. Pt denies paresthesia and 2+ peripheral pulses appreciated. Will try heating pad to back and encouraged pt to get OOB with assistance. Po medications administered earlier.

## 2013-06-17 NOTE — Progress Notes (Signed)
Post Partum Day 2 Subjective: no complaints, voiding and tolerating PO  Objective: Blood pressure 132/85, pulse 72, temperature 98.1 F (36.7 C), temperature source Oral, resp. rate 16, height 5\' 9"  (1.753 m), weight 278 lb 8 oz (126.327 kg), last menstrual period 09/12/2012, SpO2 100.00%, unknown if currently breastfeeding.   Filed Vitals:   06/17/13 0300 06/17/13 0400 06/17/13 0500 06/17/13 0600  BP: 114/58 123/74 126/86 132/85  Pulse: 89 98 87 72  Temp:  98.1 F (36.7 C)    TempSrc:  Oral    Resp:  16    Height:   5\' 9"  (1.753 m)   Weight:   278 lb 8 oz (126.327 kg)   SpO2: 95% 96% 95% 100%    Physical Exam:  General: alert, cooperative and no distress Lochia: appropriate Uterine Fundus: firm  DVT Evaluation: No evidence of DVT seen on physical exam.   Recent Labs  06/15/13 2155 06/16/13 0510  HGB 10.1* 9.1*  HCT 30.4* 27.1*    Assessment/Plan: Transfer to floor BP great Breastfeeding   LOS: 4 days   Milee Qualls H 06/17/2013, 6:57 AM

## 2013-06-17 NOTE — Progress Notes (Signed)
Discussed with Genella Rife, CNM plan for d/c of magnesium. Verbal order received to d/c it at 0500. Made her aware of current VS and urine output

## 2013-06-17 NOTE — Progress Notes (Signed)
0215 - pt. upset that baby not staying "latched"  More than 5-106min with each attempt and not "suckling well. Assisted pt. Several times with proper latch but baby loses interest shortly after. Both parents voicing concern that baby is now crying without being able to console him and request a "bottle of formula"  Discussed contraindications with parents and chance of "nipple confusion" along with benefits of exclusive breast feeding. Baby is agitated at this time? Called CN and discussed with Selena Batten, RN who has been caring for [employer] and decision made to let mother give baby 7-10cc of formula. Gave mom bottle of Gerber formula and she proceeded to give baby approx. 10cc. Baby content afterward and went to sleep

## 2013-06-18 MED ORDER — IBUPROFEN 600 MG PO TABS: 600.0000 mg | ORAL_TABLET | Freq: Four times a day (QID) | ORAL | Status: DC | PRN

## 2013-06-18 MED ORDER — OXYCODONE-ACETAMINOPHEN 5-325 MG PO TABS: 1.0000 | ORAL_TABLET | ORAL | Status: DC | PRN

## 2013-06-18 NOTE — Discharge Summary (Signed)
Obstetric Discharge Summary Reason for Admission: onset of labor Prenatal Procedures: ultrasound Intrapartum Procedures: spontaneous vaginal delivery Postpartum Procedures: none Complications-Operative and Postpartum: none Hemoglobin  Date Value Range Status  06/16/2013 9.1* 12.0 - 15.0 g/dL Final     HCT  Date Value Range Status  06/16/2013 27.1* 36.0 - 46.0 % Final    Physical Exam:  General: alert, cooperative, appears stated age and no distress Lochia: appropriate Uterine Fundus: firm Incision: n/a DVT Evaluation: No evidence of DVT seen on physical exam. Negative Homan's sign.  Discharge Diagnoses: Term Pregnancy-delivered  Discharge Information: Date: 06/18/2013 Activity: pelvic rest Diet: routine Medications: PNV, Ibuprofen and Percocet Condition: stable and improved Instructions: refer to practice specific booklet Discharge to: home Follow-up Information   Follow up with [facility]. Schedule an appointment as soon as possible for a visit in 6 weeks.   Contact information:   [address] [place] [address] [phone]      Newborn Data: Live born female  Birth Weight: 9 lb 11.7 oz (4414 g) APGAR: 8, 9  Home with mother.  Audrey Collins 06/18/2013, 7:27 AM

## 2013-06-18 NOTE — Discharge Summary (Signed)
Attestation of Attending Supervision of Advanced Practitioner (CNM/NP): Evaluation and management procedures were performed by the Advanced Practitioner under my supervision and collaboration. I have reviewed the Advanced Practitioner's note and chart, and I agree with the management and plan.  Jamae Tison H. 10:27 PM

## 2013-06-18 NOTE — Progress Notes (Signed)
Ur chart review completed.  

## 2013-09-12 ENCOUNTER — Ambulatory Visit (INDEPENDENT_AMBULATORY_CARE_PROVIDER_SITE_OTHER): Payer: Medicaid Other | Admitting: Obstetrics & Gynecology

## 2013-09-12 ENCOUNTER — Encounter: Payer: Self-pay | Admitting: Obstetrics & Gynecology

## 2013-09-12 DIAGNOSIS — O99345 Other mental disorders complicating the puerperium: Secondary | ICD-10-CM

## 2013-09-12 DIAGNOSIS — F53 Postpartum depression: Secondary | ICD-10-CM

## 2013-09-12 DIAGNOSIS — F329 Major depressive disorder, single episode, unspecified: Secondary | ICD-10-CM

## 2013-09-12 MED ORDER — SERTRALINE HCL 50 MG PO TABS: 50.0000 mg | ORAL_TABLET | Freq: Every day | ORAL | Status: DC

## 2013-09-12 MED ORDER — CITALOPRAM HYDROBROMIDE 20 MG PO TABS: 20.0000 mg | ORAL_TABLET | Freq: Every day | ORAL | Status: DC

## 2013-09-12 NOTE — Progress Notes (Signed)
  Subjective:     Audrey Collins is a 22 y.o. female who presents for a postpartum visit. She is 12 weeks postpartum following a vacuum assisted vaginal delivery. I have fully reviewed the prenatal and intrapartum course. The delivery was at 38.1 gestational weeks. Outcome: Vacuum assisted delivery.. Anesthesia: epidural. Postpartum course has been unremarkable; she is experiencing stressors in her life.  Little support system at home.  She is feeling overwhelmed.    Baby's course has been unremarkable. Baby is feeding by bottle Rush Barer Good start. Bleeding no bleeding. Bowel function is normal. Bladder function is normal. Patient is sexually active. Contraception method is condoms; she is considering Mirena. Postpartum depression screening: positive.  The following portions of the patient's history were reviewed and updated as appropriate: allergies, current medications, past medical history and problem list.  Review of Systems Pertinent items are noted in HPI.   Objective:    BP 132/91  Pulse 95  Temp(Src) 97 F (36.1 C) (Oral)  Ht 5\' 9"  (1.753 m)  Wt 246 lb 11.2 oz (111.902 kg)  BMI 36.41 kg/m2  LMP 08/29/2013  Breastfeeding? No  General:  alert, cooperative and no distress  Lungs: clear to auscultation bilaterally  Heart:  regular rate and rhythm, S1, S2 normal, no murmur, click, rub or gallop  Abdomen: soft, non-tender; bowel sounds normal; no masses,  no organomegaly   Vulva:  normal  Vagina: not evaluated  Cervix:  no cervical motion tenderness  Corpus: normal size, contour, position, consistency, mobility, non-tender  Adnexa:  normal adnexa  Rectal Exam: Not performed.        Assessment:     12 week postpartum exam. Pap smear not done at today's visit.   Plan:    1. Contraception: condoms; Patient needs to renew medicaid. She is contemplating Mirena once her Medicaid is renewed.  2. Post partum depression screen positive.  Will arrange for social work to see the  patient.  Will also start patient on SSRI.  3. Follow up in: 4 weeks or as needed.

## 2013-09-12 NOTE — Progress Notes (Signed)
CSW received call from clinic staff requesting consult for PPD.  CSW met with patient who is tearful and states everything is going well with the baby, but that her living situation/job is not ideal.  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  CSW suggests applying for work fist and daycare assistance through DSS.  CSW also recommends outpatient counseling and starting an antidepressant.  CSW will make referral to [facility], who has access to [employer] for patient without insurance.  CSW gave information for the walk in clinic at [facility] for medication management.  CSW spoke with OB resident to recommend starting an antidepressant and asked that he prescribe one on the $4 list at [facility] so that patient can afford it.

## 2013-09-12 NOTE — Addendum Note (Signed)
Addended by: Tommie Sams on: 09/12/2013 03:48 PM   Modules accepted: Orders, Medications

## 2013-10-18 ENCOUNTER — Other Ambulatory Visit: Payer: Self-pay

## 2013-12-12 IMAGING — US US OB COMP LESS 14 WK
1 series · 13 of 28 positions shown · non-contrast
Comparison: None.

CLINICAL DATA: Assess fetal viability.  Positive pregnancy test.

OBSTETRIC <14 WK ULTRASOUND
TECHNIQUE: Transabdominal ultrasound was performed for evaluation
of the gestation as well as the maternal uterus and adnexal
regions.

[Series 1: us ob comp less 14 wks · 13 of 36 slices shown]
[im 2/36]
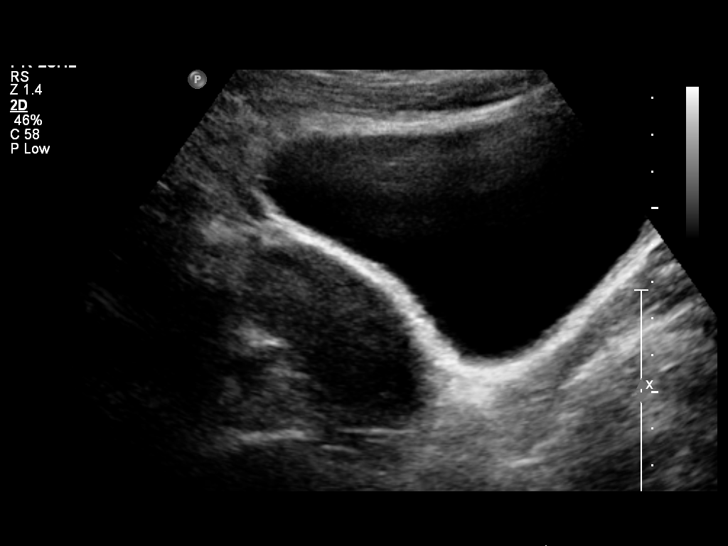
[im 4/36]
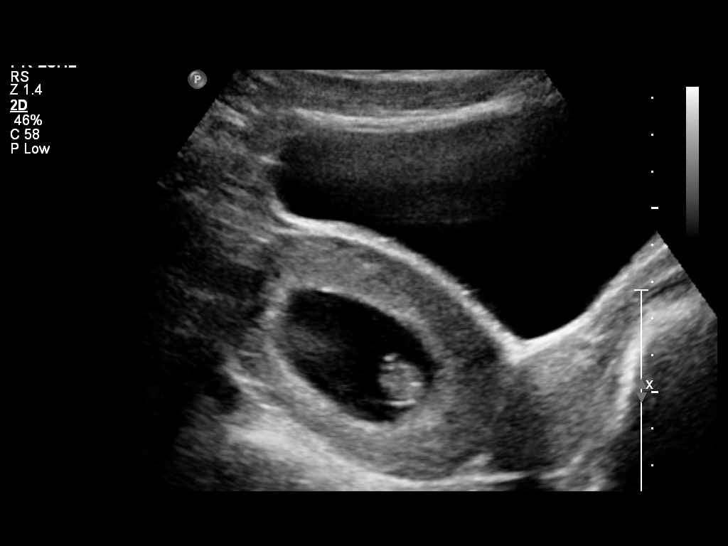
[im 7/36]
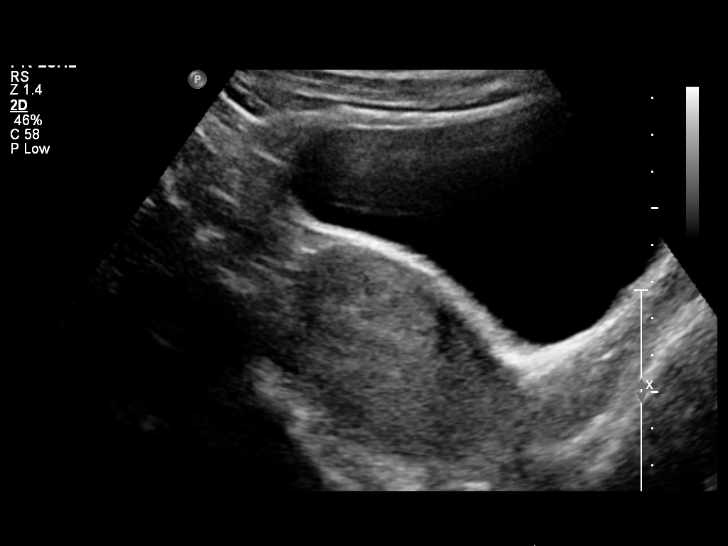
[im 10/36]
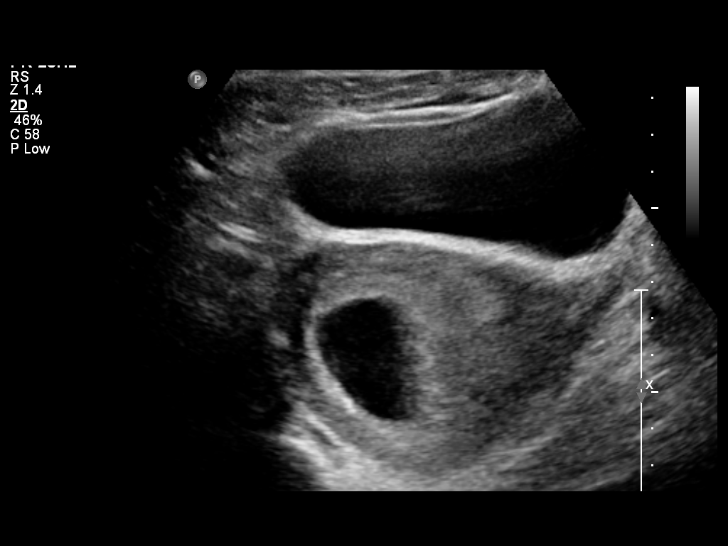
[im 12/36]
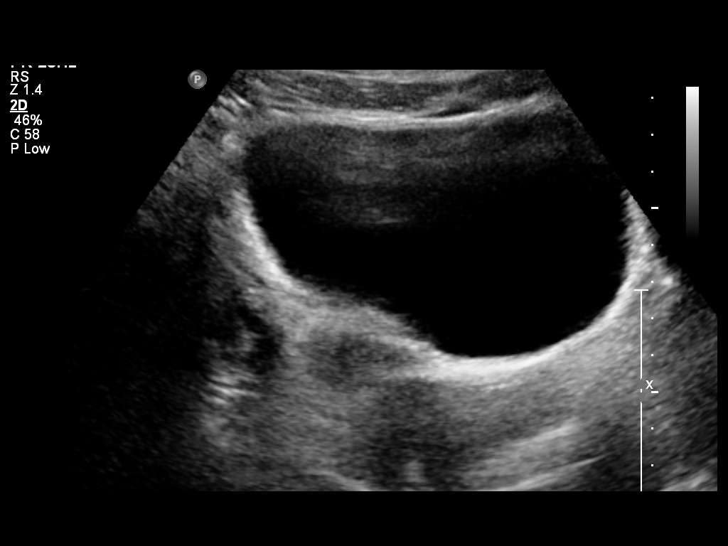
[im 15/36]
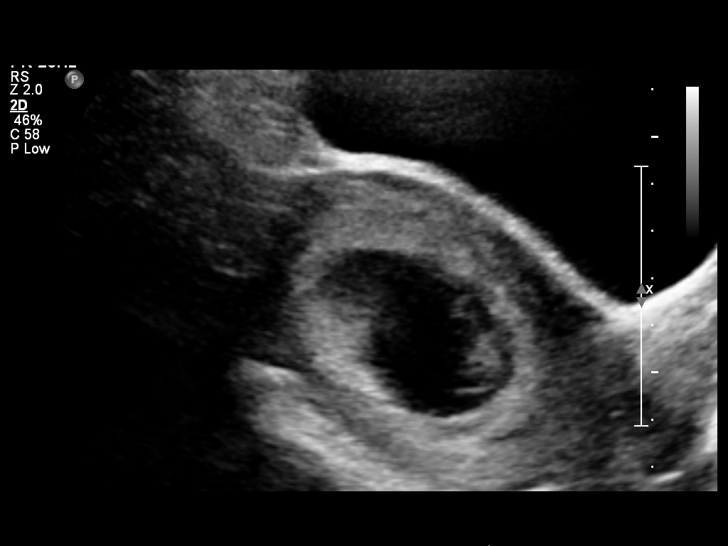
[im 19/36]
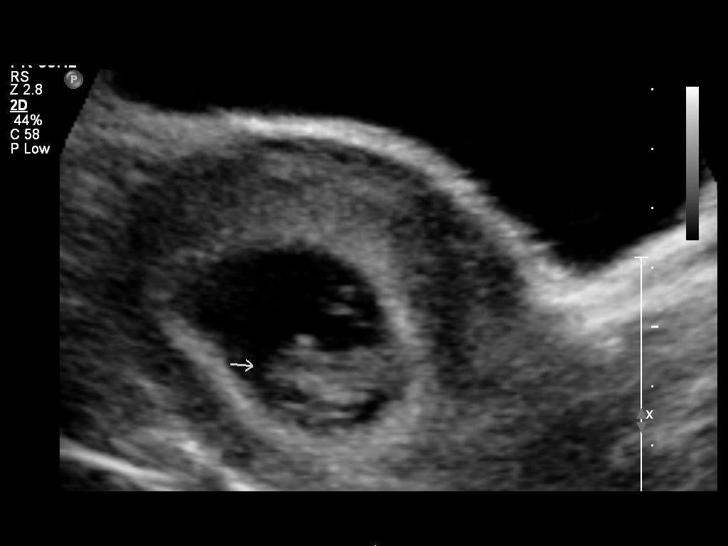
[im 21/36]
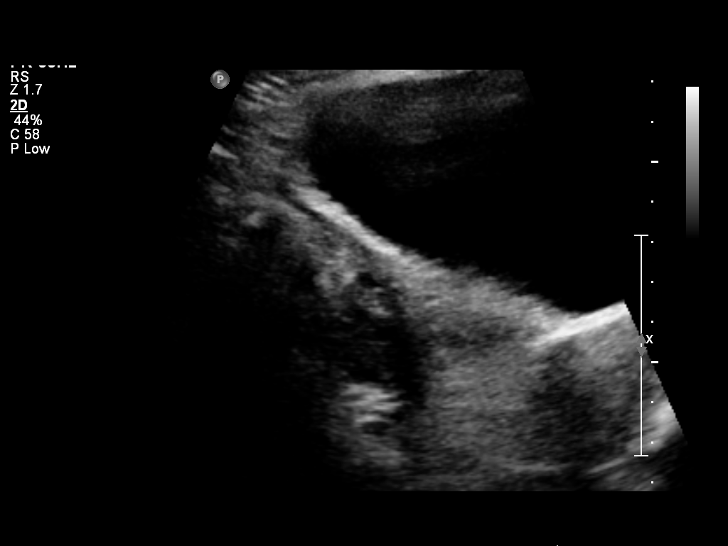
[im 24/36]
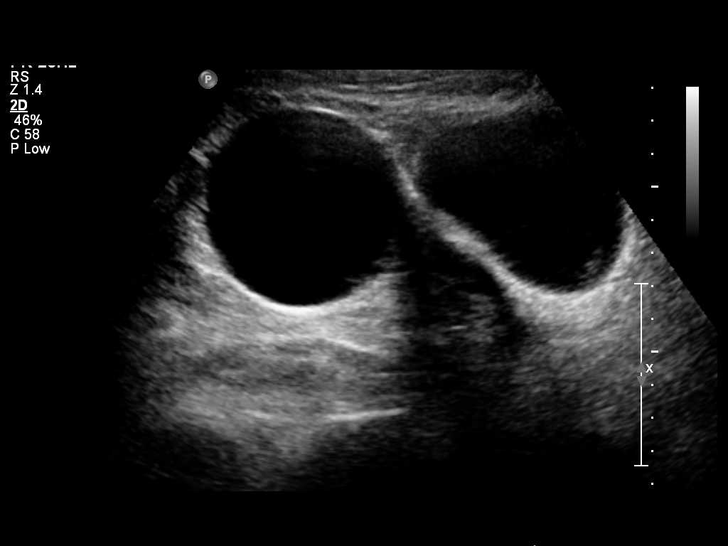
[im 26/36]
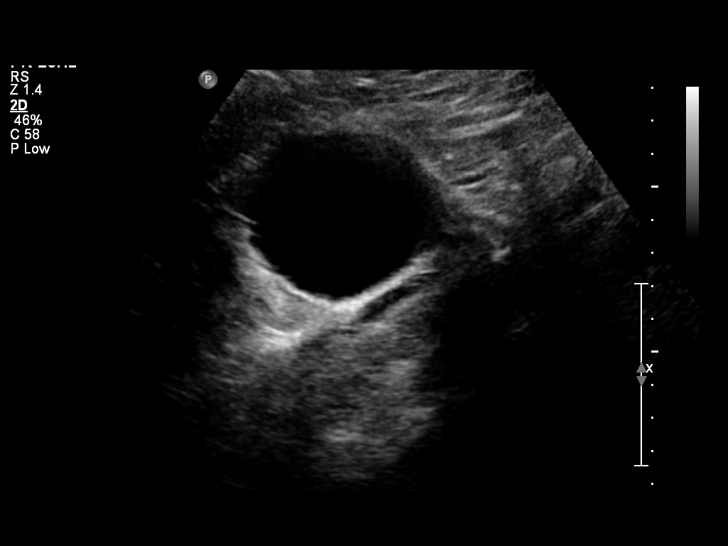
[im 29/36]
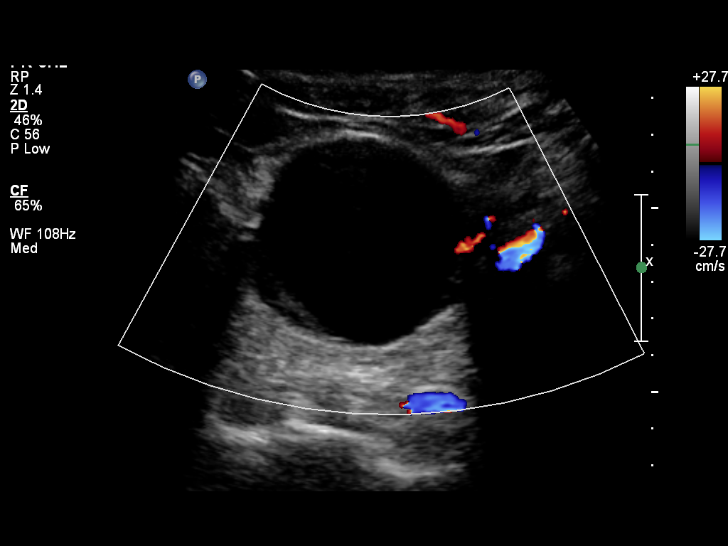
[im 32/36]
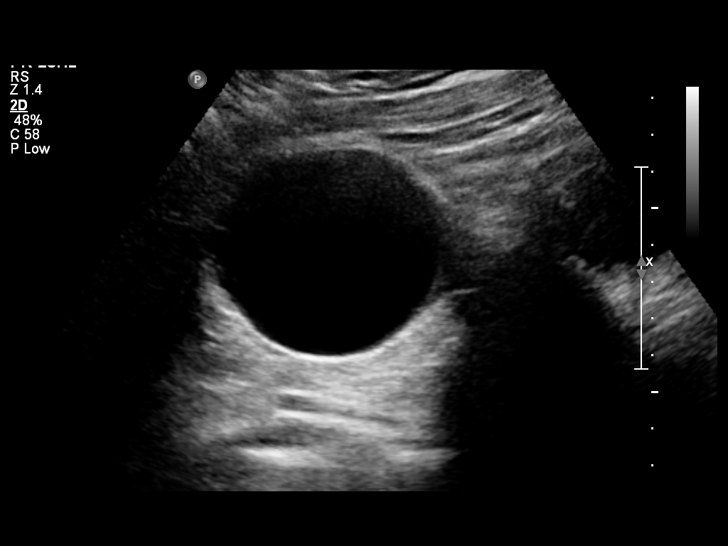
[im 34/36]
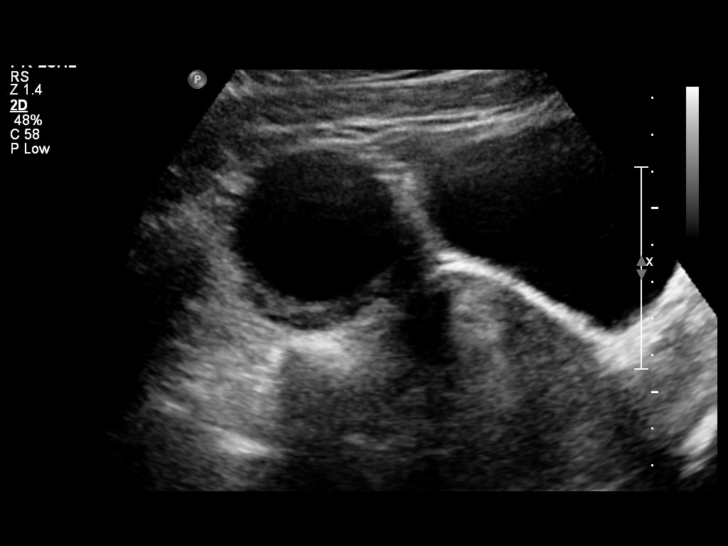

[13 of 28 positions shown; findings below may reference images not displayed]

Intrauterine gestational sac: Visualized/normal in shape.
Yolk sac: Not visualized.
Embryo: Present
Cardiac Activity: Present
Heart Rate: 172 bpm

MSD:  mm   w   d
CRL:  25 mm  9 w  2 d         US EDC: 06/26/2013.

Maternal uterus/Adnexae:
Left ovary measures 59 mm x 55 mm x 52 mm with a large simple
ovarian cyst that displaces the ovarian parenchyma to the
periphery.  No mural nodule.  Normal increased through transmission
compatible with a benign cyst.
IMPRESSION: 1.  Single viable intrauterine pregnancy with estimated gestational
age 9 weeks 2 days.  Fetal heart tones of 172 beats per minute.
2.  Simple left ovarian cyst, measuring 5.9 cm long axis.  Normal
color flow to the peripherally displaced ovarian tissue.  In the
setting of pregnancy, the cyst can be followed up on subsequent
ultrasound examinations.  If the cyst persists, annual follow-up is
appropriate.This recommendation follows the consensus statement:
Management of Asymptomatic Ovarian and Other Adnexal Cysts Imaged
at US:  Society of Radiologists in Ultrasound Consensus Conference

## 2014-01-21 IMAGING — US US OB TRANSVAGINAL
1 series · 14 of 28 positions shown · non-contrast
Comparison: none

CLINICAL DATA: Right lower quadrant pain.  Evaluate cervical
length.

[Series 1: us ob follow up · 14 of 37 slices shown]
[im 2/37]
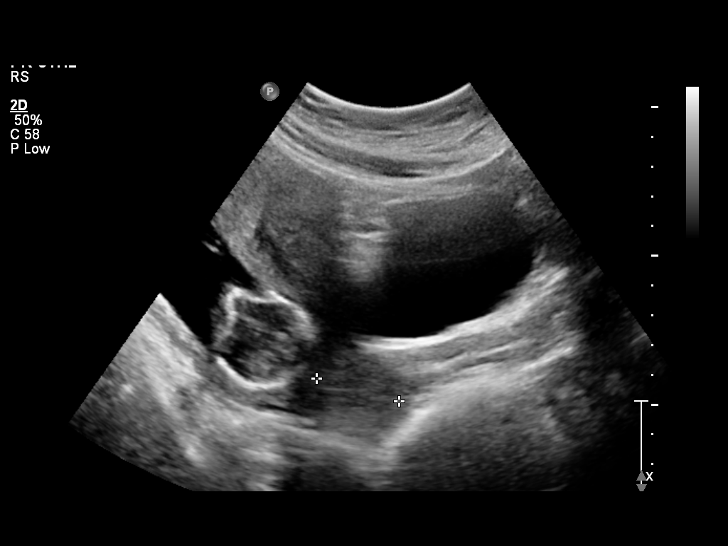
[im 5/37]
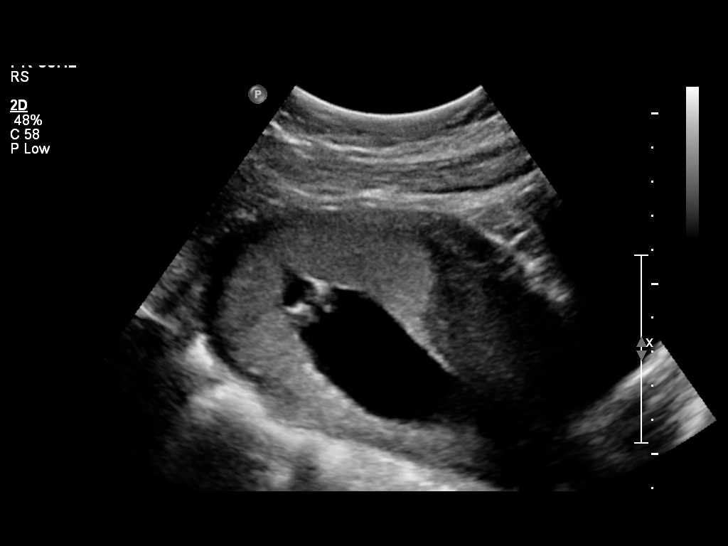
[im 7/37]
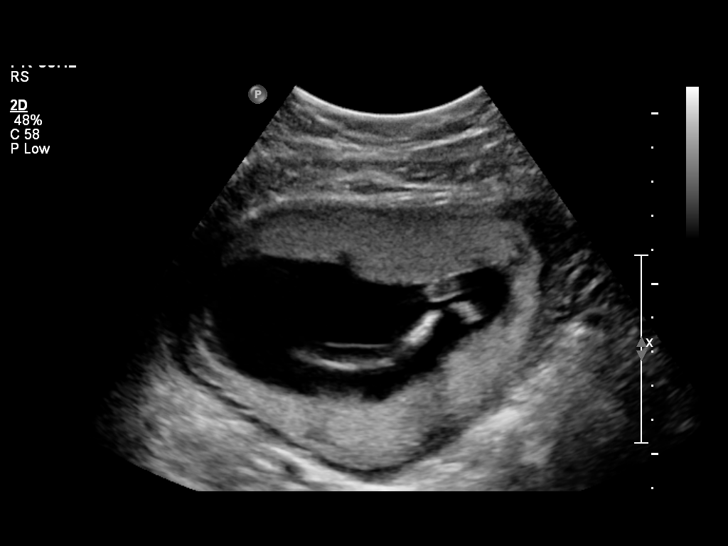
[im 10/37]
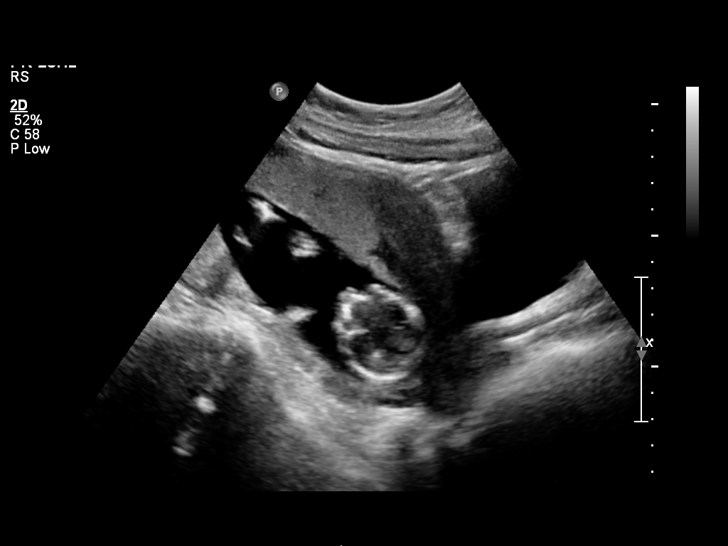
[im 13/37]
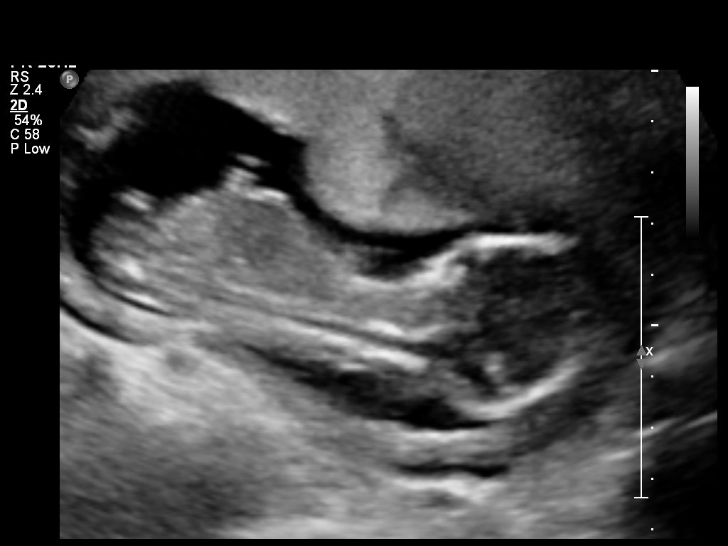
[im 15/37]
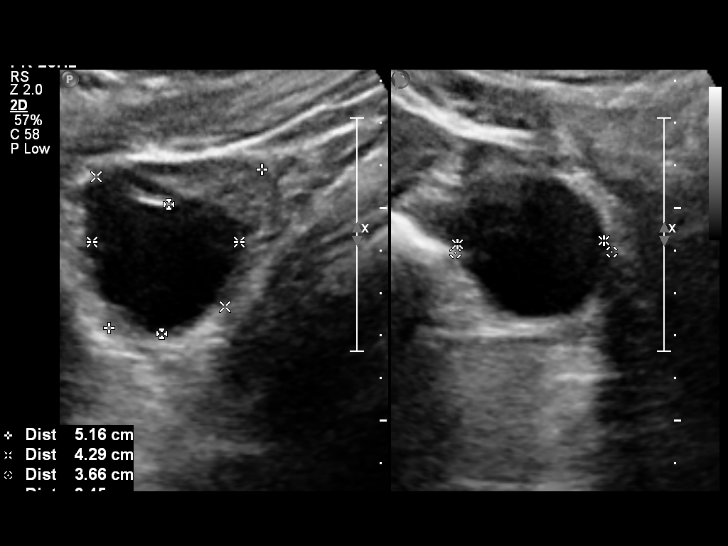
[im 18/37]
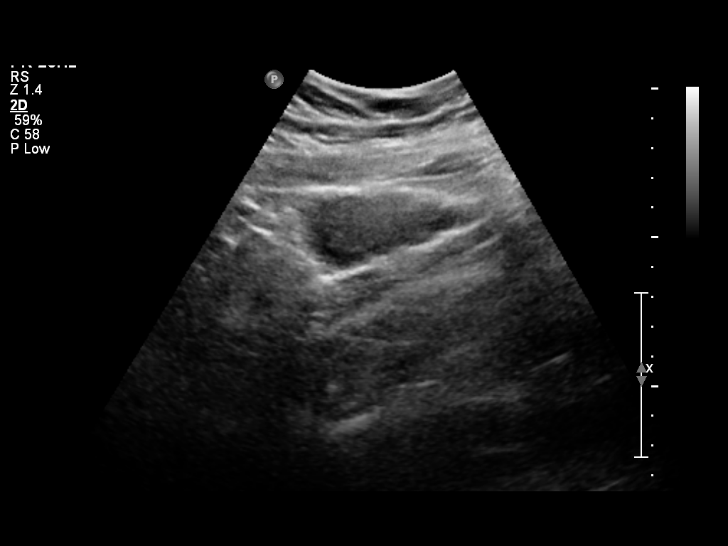
[im 21/37]
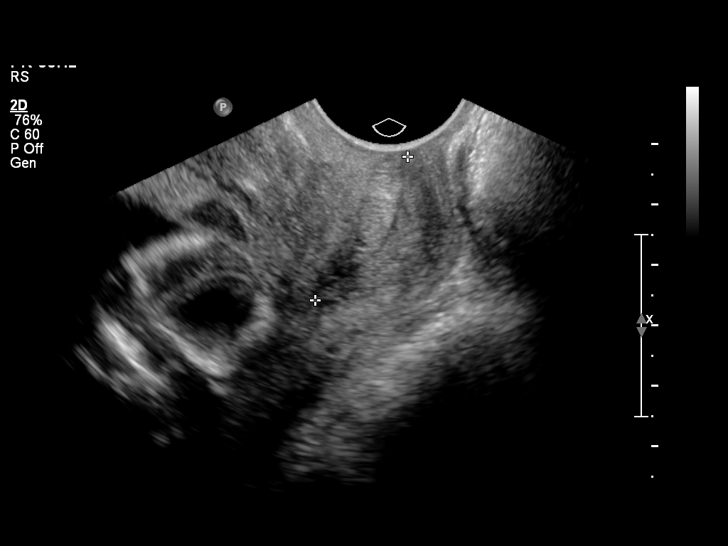
[im 23/37]
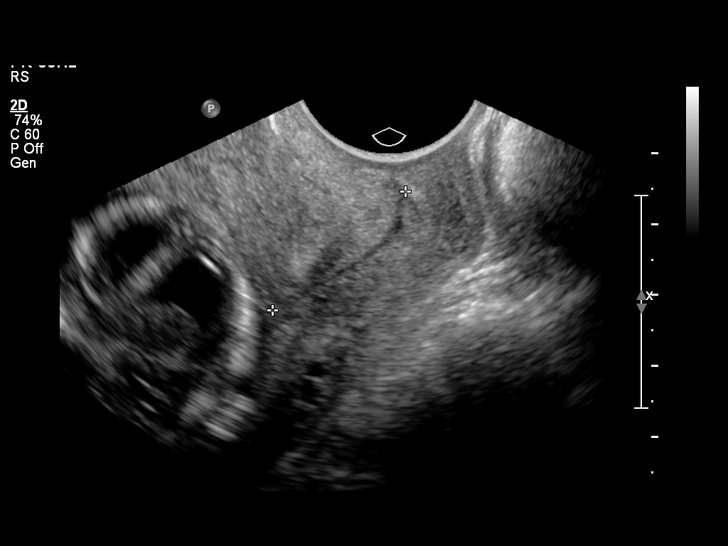
[im 26/37]
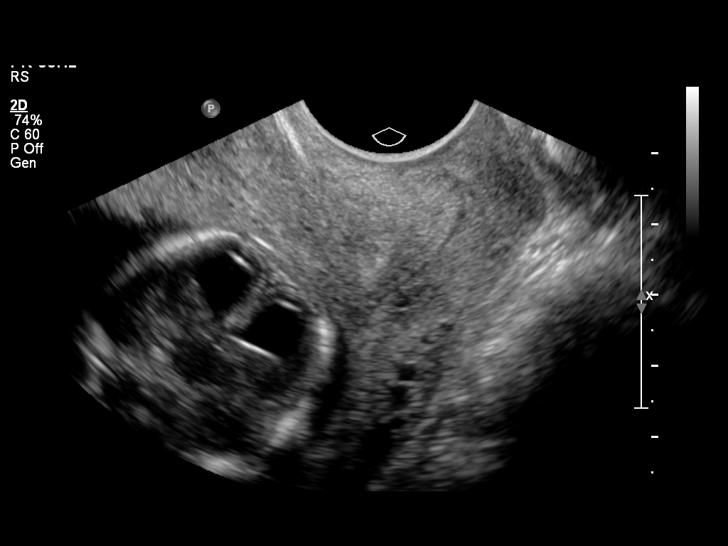
[im 29/37]
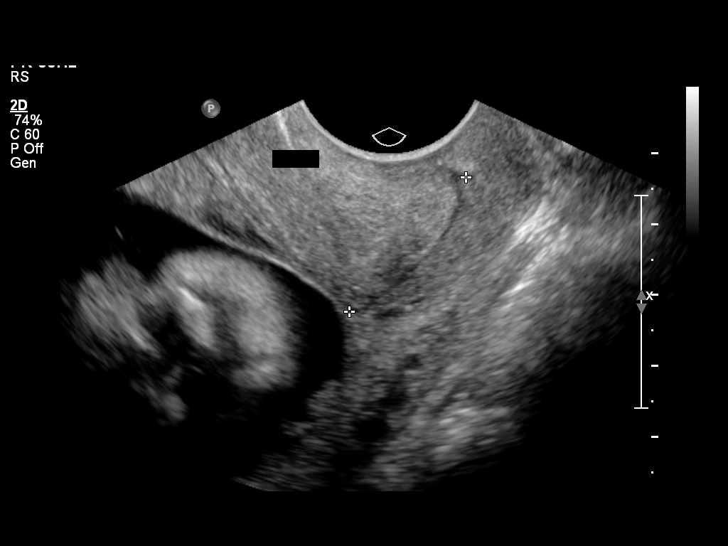
[im 31/37]
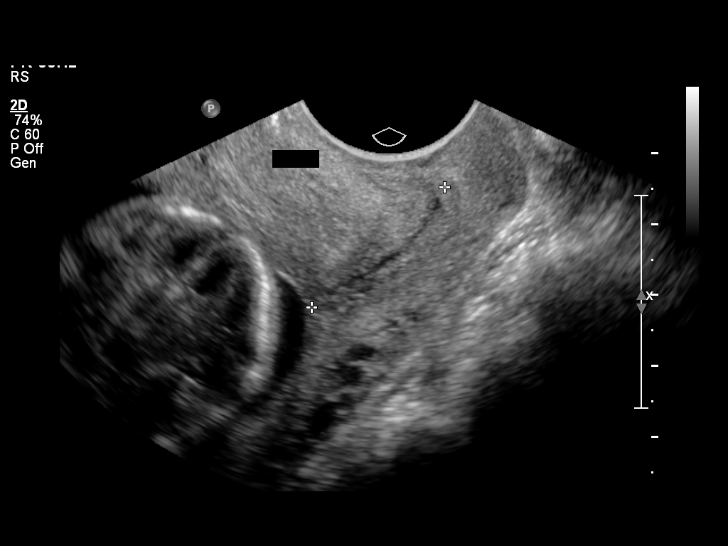
[im 34/37]
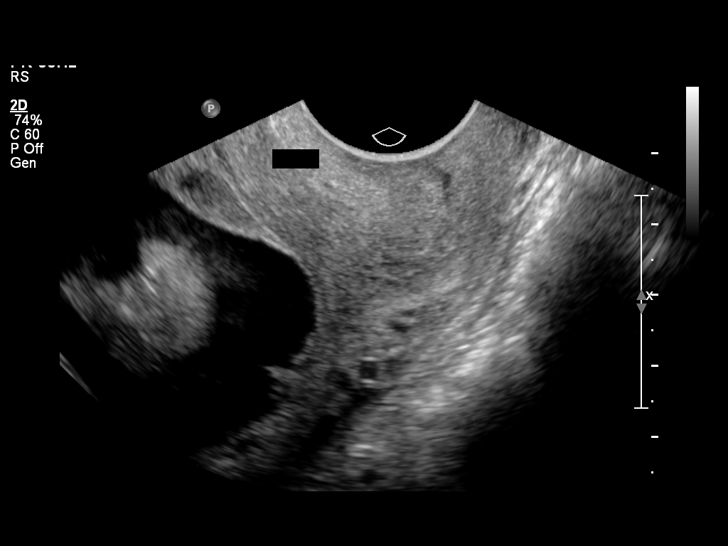
[im 37/37]
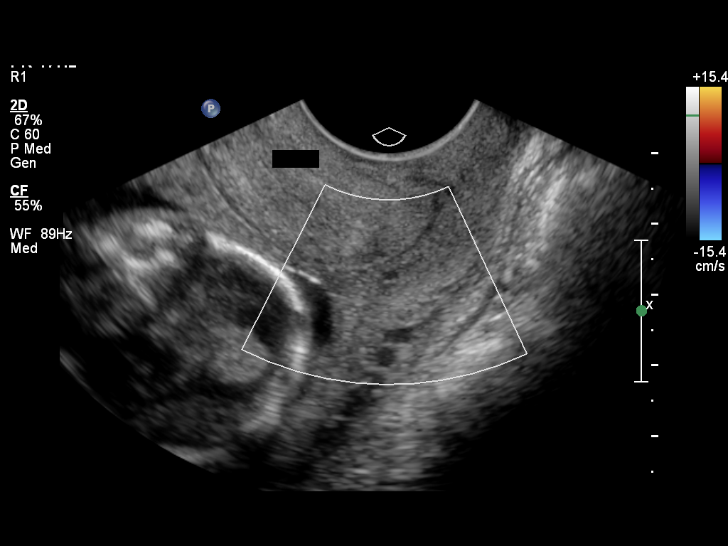

[14 of 28 positions shown; findings below may reference images not displayed]

LIMITED OBSTETRIC ULTRASOUND; TRANSVAGINAL OBSTETRIC ULTRASOUND

Number of Fetuses: 1
Heart Rate: 147 bpm
Movement: Yes
Presentation: cephalic
Placental Location: Anterior
Previa: Marginal
Amniotic Fluid (Subjective): Normal

BPD:  3.2 cm      16 w  0 d         EDC:  06/19/2013

MATERNAL FINDINGS:
Cervix: Closed.  Cervical length measures 2.5 cm by transvaginal
ultrasound. The inferior margin of the placenta abuts the internal
loss of the cervix.

Uterus/Adnexae:  Left ovary contains a cyst that measures up to
cm.  Normal appearance of the right ovary.
IMPRESSION: The cervix is closed and the cervix measures 2.5 cm in length.

Marginal placenta previa.

Gestational age by ultrasound is 16 weeks and 0 days.

Left ovarian cyst.

Recommend followup with non-emergent complete OB 14+ wk US
examination for fetal biometric evaluation and anatomic survey if
not already performed.

## 2014-02-06 IMAGING — US US OB DETAIL+14 WK
1 series · 12 of 28 positions shown · non-contrast
Comparison: none

[Series 1: us ob detail+14 wk · 0.18mm/px · 12 of 120 slices shown]
[im 5/120]
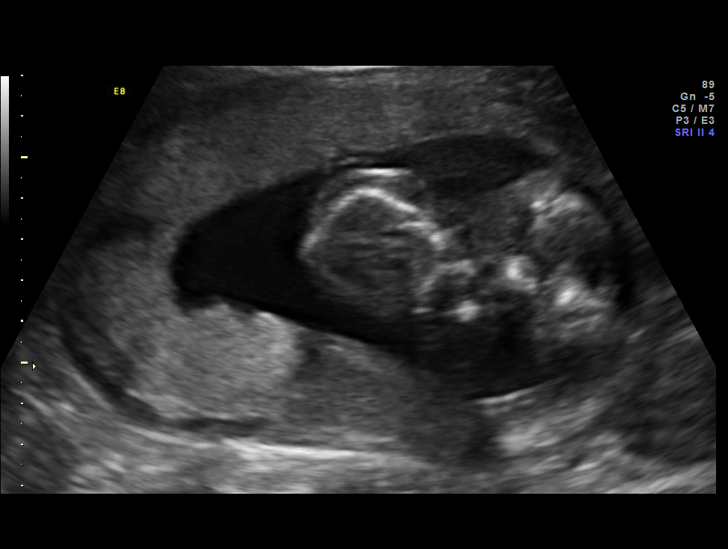
[im 14/120]
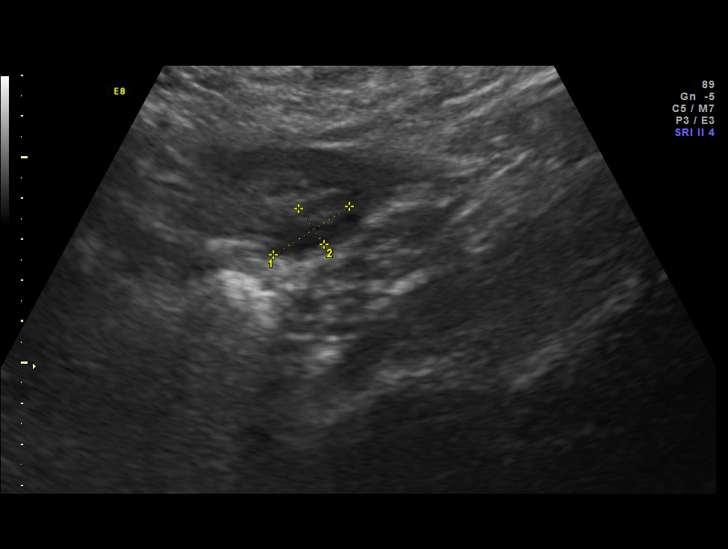
[im 23/120]
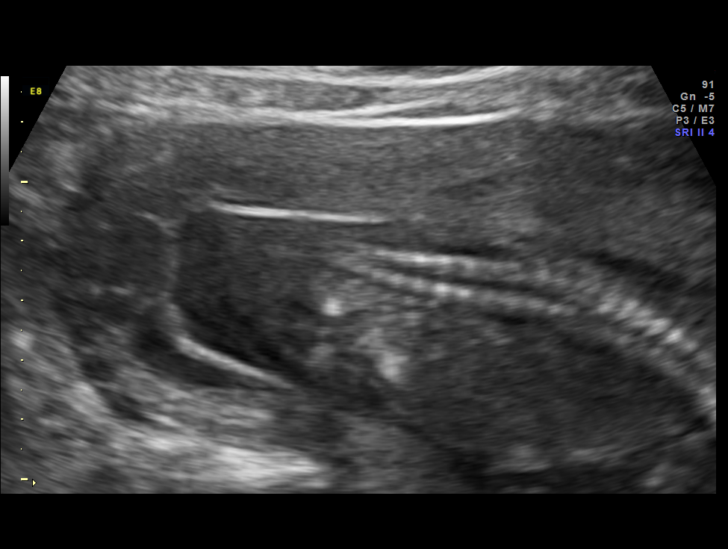
[im 36/120]
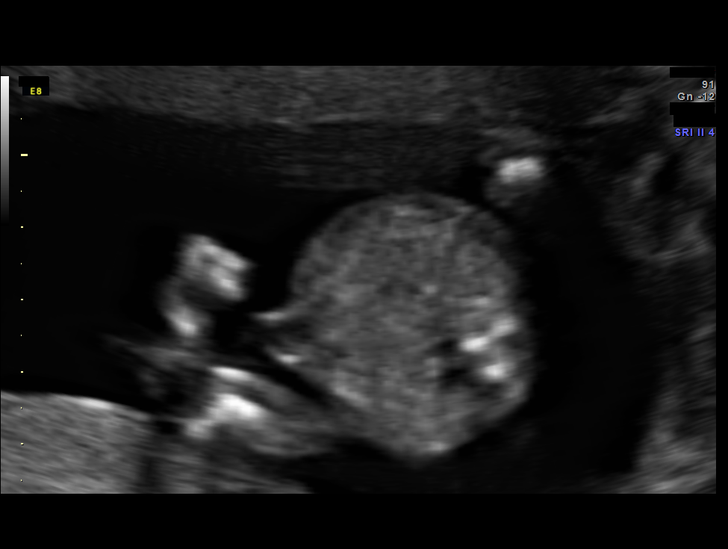
[im 45/120]
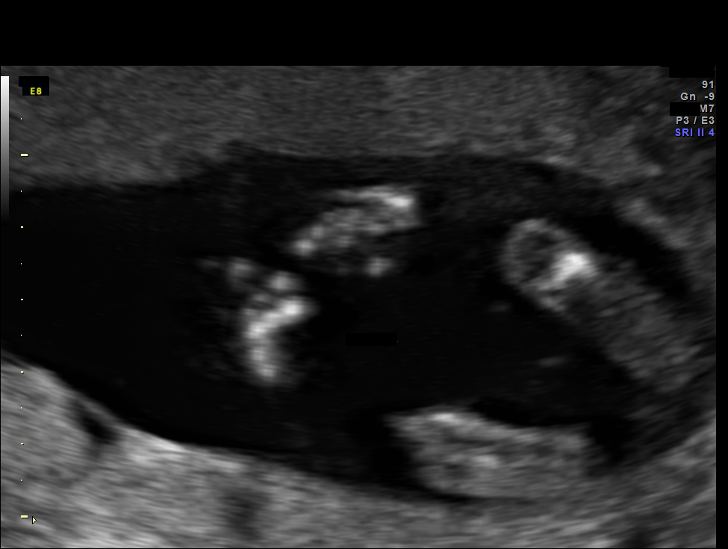
[im 53/120]
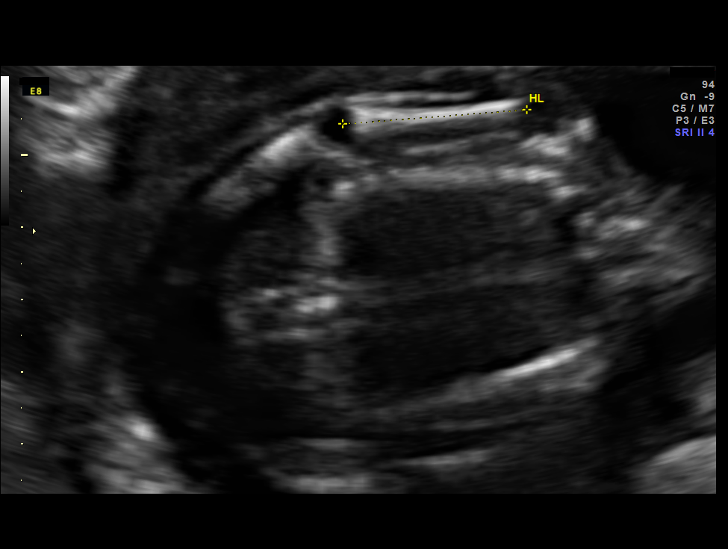
[im 67/120]
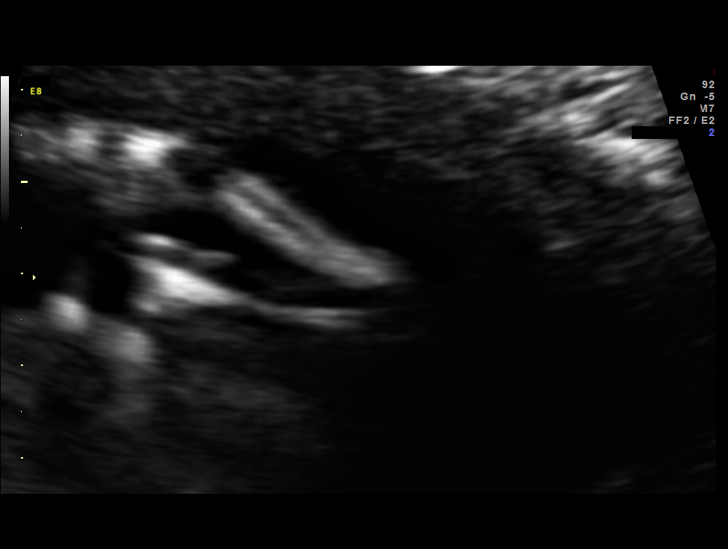
[im 75/120]
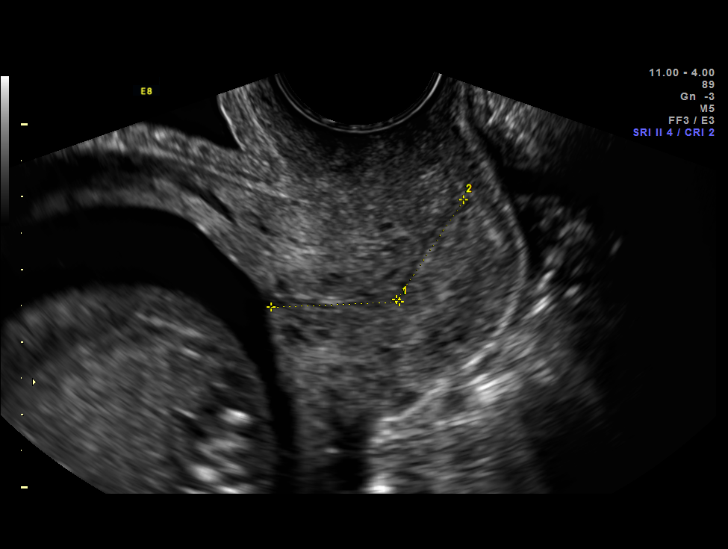
[im 84/120]
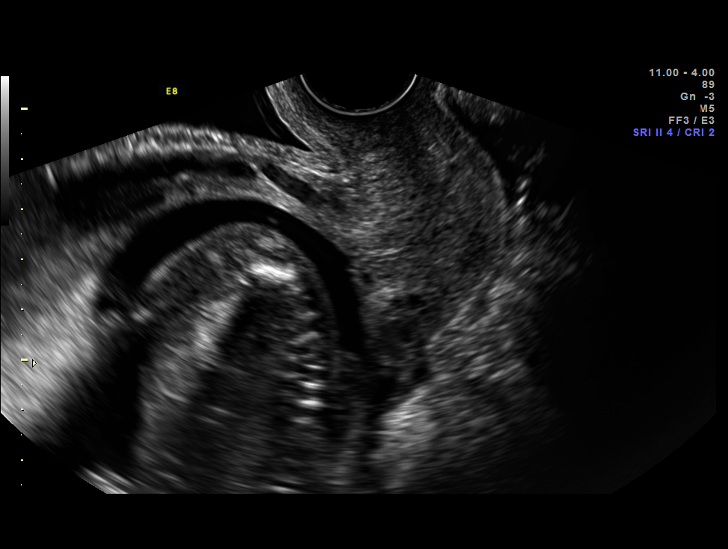
[im 97/120]
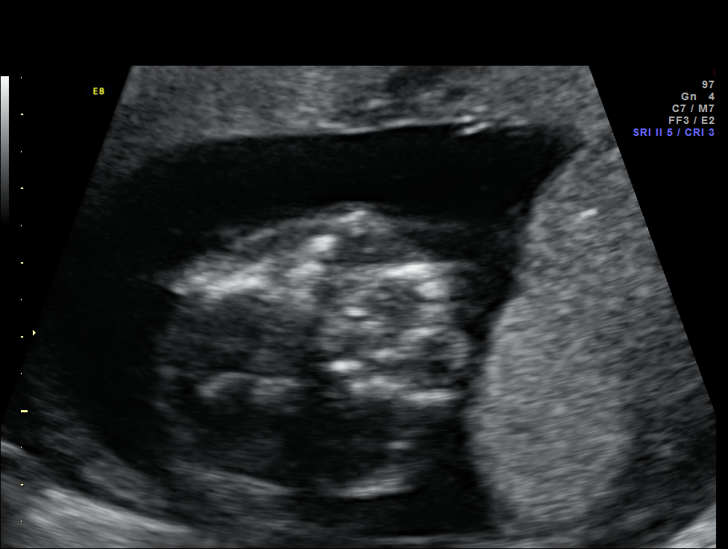
[im 106/120]
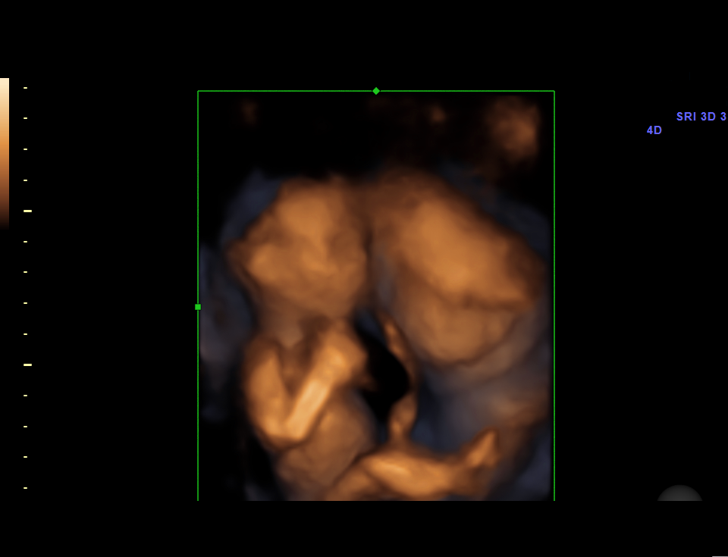
[im 115/120]
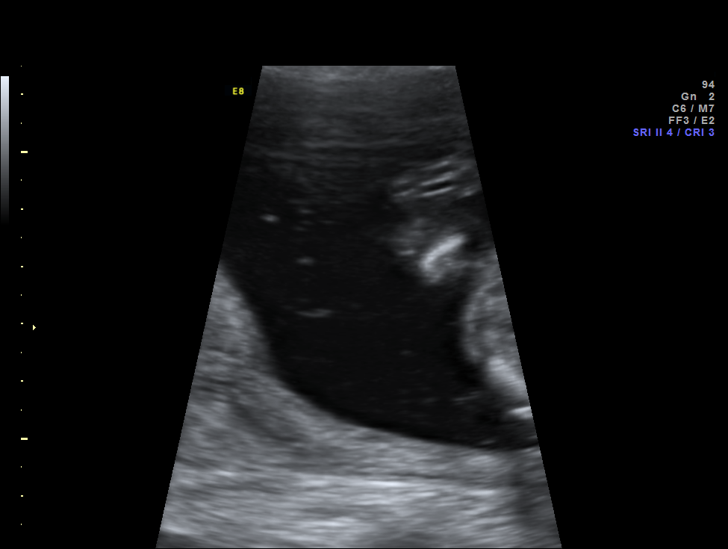

[12 of 28 positions shown; findings below may reference images not displayed]

OBSTETRICS REPORT
                      (Signed Final 01/18/2013 [DATE])

Service(s) Provided

 US OB DETAIL + 14 WK                                  76811.0
 US OB TRANSVAGINAL                                    76817.0
Indications

 Cervical shortening
 Detailed fetal anatomic survey
Fetal Evaluation

 Num Of Fetuses:    1
 Fetal Heart Rate:  147                          bpm
 Cardiac Activity:  Observed
 Presentation:      Variable
 Placenta:          Fundal, above cervical os
 P. Cord            Visualized, central
 Insertion:

 Amniotic Fluid
 AFI FV:      Subjectively within normal limits
                                             Larg Pckt:     4.9  cm
Biometry

 BPD:     40.2  mm     G. Age:  18w 1d                CI:         79.4   70 - 86
 OFD:     50.6  mm                                    FL/HC:      15.6   14.6 -

 HC:     145.4  mm     G. Age:  17w 5d       64  %    HC/AC:      1.17   1.07 -

 AC:     124.6  mm     G. Age:  18w 0d       75  %    FL/BPD:
 FL:      22.7  mm     G. Age:  16w 6d       27  %    FL/AC:      18.2   20 - 24
 HUM:     24.4  mm     G. Age:  17w 4d       65  %
 CER:     17.8  mm     G. Age:  17w 6d       60  %
 NFT:      2.3  mm

 Est. FW:     198  gm      0 lb 7 oz     57  %
Gestational Age

 LMP:           18w 2d        Date:  09/12/12                 EDD:   06/19/13
 U/S Today:     17w 5d                                        EDD:   06/23/13
 Best:          17w 2d     Det. By:  Early Ultrasound         EDD:   06/26/13
                                     (11/23/12)
Anatomy

 Cranium:          Appears normal         Aortic Arch:      Appears normal
 Fetal Cavum:      Appears normal         Ductal Arch:      Appears normal
 Ventricles:       Appears normal         Diaphragm:        Appears normal
 Choroid Plexus:   Appears normal         Stomach:          Appears normal, left
                                                            sided
 Cerebellum:       Appears normal         Abdomen:          Appears normal
 Posterior Fossa:  Appears normal         Abdominal Wall:   Appears nml (cord
                                                            insert, abd wall)
 Nuchal Fold:      Appears normal         Cord Vessels:     Appears normal (3
                                                            vessel cord)
 Face:             Appears normal         Kidneys:          Appear normal
                   (orbits and profile)
 Lips:             Appears normal         Bladder:          Appears normal
 Heart:            Appears normal         Spine:            Limited but no
                   (4CH, axis, and                          intracranial signs of
                   situs)                                   NTD
 RVOT:             Appears normal         Lower             Appears normal
                                          Extremities:
 LVOT:             Appears normal         Upper             Appears normal
                                          Extremities:

 Other:  Heels and 5th digit visualized. Technically difficult due to advanced
         GA and fetal position.
Cervix Uterus Adnexa

 Cervical Length:    3.4      cm

 Cervix:       Normal appearance by transvaginal scan
 Left Ovary:    Within normal limits. Small corpus luteum noted.
 Right Ovary:   Within normal limits.
Impression

 IUP at 17+2 weeks
 Normal detailed fetal anatomy
 Markers of aneuploidy: none
 Normal amniotic fluid volume
 Measurements consistent with prior 9 week US
 EV views of cervix: normal length without funneling

 The US findings were shared with Ms. Irrsetible. The
 implications of a normal detailed US on her T21 risk were
 discussed in detail. All of her prenatal testing options were
 reviewed. After careful consideration, she declined
 amniocentesis and NIPT.
Recommendations

 Follow-up as clinically indicated
 Please see genetic counseling note

## 2014-09-27 ENCOUNTER — Other Ambulatory Visit: Payer: Self-pay

## 2014-10-14 ENCOUNTER — Encounter: Payer: Self-pay | Admitting: Obstetrics & Gynecology

## 2014-10-18 ENCOUNTER — Inpatient Hospital Stay (HOSPITAL_COMMUNITY)
Admission: AD | Admit: 2014-10-18 | Discharge: 2014-10-19 | Disposition: A | Discharge status: Home / Self Care | Payer: Medicaid Other | Source: Ambulatory Visit | Attending: Obstetrics & Gynecology | Admitting: Obstetrics & Gynecology

## 2014-10-18 ENCOUNTER — Encounter (HOSPITAL_COMMUNITY): Payer: Self-pay | Admitting: *Deleted

## 2014-10-18 DIAGNOSIS — Z87891 Personal history of nicotine dependence: Secondary | ICD-10-CM | POA: Insufficient documentation

## 2014-10-18 DIAGNOSIS — J069 Acute upper respiratory infection, unspecified: Secondary | ICD-10-CM | POA: Insufficient documentation

## 2014-10-18 DIAGNOSIS — Z3202 Encounter for pregnancy test, result negative: Secondary | ICD-10-CM | POA: Diagnosis present

## 2014-10-18 HISTORY — DX: Gestational (pregnancy-induced) hypertension without significant proteinuria, unspecified trimester: O13.9

## 2014-10-18 HISTORY — DX: Bipolar disorder, unspecified: F31.9

## 2014-10-18 LAB — URINALYSIS, ROUTINE W REFLEX MICROSCOPIC
BILIRUBIN URINE: NEGATIVE
Glucose, UA: NEGATIVE mg/dL
Hgb urine dipstick: NEGATIVE
Ketones, ur: NEGATIVE mg/dL
LEUKOCYTES UA: NEGATIVE
NITRITE: NEGATIVE
PH: 6 (ref 5.0–8.0)
Protein, ur: NEGATIVE mg/dL
Urobilinogen, UA: 0.2 mg/dL (ref 0.0–1.0)

## 2014-10-18 LAB — POCT PREGNANCY, URINE: Preg Test, Ur: NEGATIVE

## 2014-10-18 MED ORDER — PROMETHAZINE-DM 6.25-15 MG/5ML PO SYRP: 5.0000 mL | ORAL_SOLUTION | Freq: Four times a day (QID) | ORAL | Status: DC | PRN

## 2014-10-18 NOTE — MAU Provider Note (Signed)
History     CSN: [id number]  Arrival date and time: 10/18/14 2244   First Provider Initiated Contact with Patient 10/18/14 2330      Chief Complaint  Patient presents with  . Cough  . Fatigue  . Nausea  . Possible Pregnancy  . Abdominal Pain   HPI   Audrey Collins is a 23 y.o. G1P1001 who presents with pregnancy symptoms and would like a blood test done. She has had URI symptoms for two days. Symptoms include body aches, constipation, sneezing, runny dose, sore throat. She has not taken anything over the counter for the symptoms.   OB History    Gravida Para Term Preterm AB TAB SAB Ectopic Multiple Living   1 1 1       1       Past Medical History  Diagnosis Date  . Placenta previa marginalis     resolved per pt  . Anxiety   . Short cervix affecting pregnancy     took prometrium, resolved  . Chlamydia infection complicating pregnancy   . Obesity   . Bipolar 1 disorder   . Pregnancy induced hypertension     Past Surgical History  Procedure Laterality Date  . No past surgeries      Family History  Problem Relation Age of Onset  . Hypertension Father   . Thyroid disease Mother   . Diabetes Paternal Grandfather   . Diabetes Maternal Grandfather     History  Substance Use Topics  . Smoking status: Former Smoker -- 0.25 packs/day for .5 years    Quit date: 10/13/2012  . Smokeless tobacco: Never Used  . Alcohol Use: No     Comment: none since finding out pregnant    Allergies: No Known Allergies  Prescriptions prior to admission  Medication Sig Dispense Refill Last Dose  . lamoTRIgine (LAMICTAL) 25 MG tablet Take 25 mg by mouth daily. Has not gotten script filled yet     . citalopram (CELEXA) 20 MG tablet Take 1 tablet (20 mg total) by mouth daily. 30 tablet 1   . Prenatal Vit-Fe Fumarate-FA (PRENATAL MULTIVITAMIN) TABS Take 1 tablet by mouth daily.   Not Taking   Results for orders placed or performed during the hospital encounter of 10/18/14  (from the past 48 hour(s))  Urinalysis, Routine w reflex microscopic     Status: Abnormal   Collection Time: 10/18/14 10:03 PM  Result Value Ref Range   Color, Urine YELLOW YELLOW   APPearance CLEAR CLEAR   Specific Gravity, Urine >1.030 (H) 1.005 - 1.030   pH 6.0 5.0 - 8.0   Glucose, UA NEGATIVE NEGATIVE mg/dL   Hgb urine dipstick NEGATIVE NEGATIVE   Bilirubin Urine NEGATIVE NEGATIVE   Ketones, ur NEGATIVE NEGATIVE mg/dL   Protein, ur NEGATIVE NEGATIVE mg/dL   Urobilinogen, UA 0.2 0.0 - 1.0 mg/dL   Nitrite NEGATIVE NEGATIVE   Leukocytes, UA NEGATIVE NEGATIVE    Comment: MICROSCOPIC NOT DONE ON URINES WITH NEGATIVE PROTEIN, BLOOD, LEUKOCYTES, NITRITE, OR GLUCOSE <1000 mg/dL.  Pregnancy, urine POC     Status: None   Collection Time: 10/18/14 11:09 PM  Result Value Ref Range   Preg Test, Ur NEGATIVE NEGATIVE    Comment:        THE SENSITIVITY OF THIS METHODOLOGY IS >24 mIU/mL     Review of Systems  Constitutional: Negative for fever and chills.  HENT: Positive for congestion and sore throat.   Respiratory: Positive for cough and sputum production.  Negative for shortness of breath.   Gastrointestinal: Positive for constipation.   Physical Exam   Blood pressure 135/96, pulse 104, temperature 98.6 F (37 C), resp. rate 18, height 5\' 10"  (1.778 m), weight 98.975 kg (218 lb 3.2 oz), last menstrual period 09/14/2014, SpO2 100 %, not currently breastfeeding.  Physical Exam  Nursing note and vitals reviewed. Constitutional: She is oriented to person, place, and time. She appears well-developed and well-nourished. No distress.  HENT:  Head: Normocephalic.  Mouth/Throat: Uvula is midline and mucous membranes are normal. Posterior oropharyngeal edema and posterior oropharyngeal erythema present. No oropharyngeal exudate or tonsillar abscesses.  Eyes: Pupils are equal, round, and reactive to light.  Neck: Neck supple.  Cardiovascular: Normal rate and normal heart sounds.    Respiratory: Effort normal and breath sounds normal. No respiratory distress.  Musculoskeletal: Normal range of motion.  Neurological: She is alert and oriented to person, place, and time.  Skin: Skin is warm. She is not diaphoretic.  Psychiatric: Her behavior is normal.    MAU Course  Procedures  None  MDM Pregnancy test negative No fevers   Assessment and Plan   A: 1. Upper respiratory virus   2. Negative pregnancy test    P: Discharge home in stable condition RX: phenergan cough Ok to take zyrtec, tylenol cold for symptoms as directed on the bottle. Increase PO fluids Return to MAU for emergencies.   Iona HansenJennifer Irene Kely Dohn, NP 10/18/2014 11:50 PM

## 2014-10-18 NOTE — Progress Notes (Signed)
Jennifer Rasch NP in earlier to discuss test results and d/c plan. Written and verbal d/c instructions given and understanding voiced. 

## 2014-10-18 NOTE — Discharge Instructions (Signed)
Cool Mist Vaporizers Vaporizers may help relieve the symptoms of a cough and cold. They add moisture to the air, which helps mucus to become thinner and less sticky. This makes it easier to breathe and cough up secretions. Cool mist vaporizers do not cause serious burns like hot mist vaporizers, which may also be called steamers or humidifiers. Vaporizers have not been proven to help with colds. You should not use a vaporizer if you are allergic to mold. HOME CARE INSTRUCTIONS  Follow the package instructions for the vaporizer.  Do not use anything other than distilled water in the vaporizer.  Do not run the vaporizer all of the time. This can cause mold or bacteria to grow in the vaporizer.  Clean the vaporizer after each time it is used.  Clean and dry the vaporizer well before storing it.  Stop using the vaporizer if worsening respiratory symptoms develop. Document Released: 08/26/2004 Document Revised: 12/04/2013 Document Reviewed: 04/18/2013 [facility] Patient Information 2015 [place], [place]. This information is not intended to replace advice given to you by your health care provider. Make sure you discuss any questions you have with your health care provider.  Cough, Adult  A cough is a reflex. It helps you clear your throat and airways. A cough can help heal your body. A cough can last 2 or 3 weeks (acute) or may last more than 8 weeks (chronic). Some common causes of a cough can include an infection, allergy, or a cold. HOME CARE  Only take medicine as told by your doctor.  If given, take your medicines (antibiotics) as told. Finish them even if you start to feel better.  Use a cold steam vaporizer or humidifier in your home. This can help loosen thick spit (secretions).  Sleep so you are almost sitting up (semi-upright). Use pillows to do this. This helps reduce coughing.  Rest as needed.  Stop smoking if you smoke. GET HELP RIGHT AWAY IF:  You have yellowish-white fluid  (pus) in your thick spit.  Your cough gets worse.  Your medicine does not reduce coughing, and you are losing sleep.  You cough up blood.  You have trouble breathing.  Your pain gets worse and medicine does not help.  You have a fever. MAKE SURE YOU:   Understand these instructions.  Will watch your condition.  Will get help right away if you are not doing well or get worse. Document Released: 08/12/2011 Document Revised: 04/15/2014 Document Reviewed: 08/12/2011 [facility] Patient Information 2015 [place], [place]. This information is not intended to replace advice given to you by your health care provider. Make sure you discuss any questions you have with your health care provider.  Pregnancy Tests HOW DO PREGNANCY TESTS WORK? All pregnancy tests look for a special hormone in the urine or blood that is only present in pregnant women. This hormone, human chorionic gonadotropin (hCG), is also called the pregnancy hormone.  WHAT IS THE DIFFERENCE BETWEEN A URINE AND A BLOOD PREGNANCY TEST? IS ONE BETTER THAN THE OTHER? There are two types of pregnancy tests.  Blood tests.  Urine tests. Both tests look for the presence of hCG, the pregnancy hormone. Many women use a urine test or home pregnancy test (HPT) to find out if they are pregnant. HPTs are cheap, easy to use, can be done at home, and are private. When a woman has a positive result on an HPT, she needs to see her caregiver right away. The caregiver can confirm a positive HPT result with  another urine test, a blood test, ultrasound, and a pelvic exam.  There are two types of blood tests you can get from a caregiver.   A quantitative blood test (or the beta hCG test). This test measures the exact amount of hCG in the blood. This means it can pick up very small amounts of hCG, making it a very accurate test.  A qualitative hCG blood test. This test gives a simple yes or no answer to whether you are pregnant. This test is more like  a urine test in terms of its accuracy. Blood tests can pick up hCG earlier in a pregnancy than urine tests can. Blood tests can tell if you are pregnant about 6 to 8 days after you release an egg from an ovary (ovulate). Urine tests can determine pregnancy about 2 weeks after ovulation.  HOW IS A HOME PREGNANCY TEST DONE?  There are many types of home pregnancy tests or HPTs that can be bought over-the-counter at drug or discount stores.   Some involve collecting your urine in a cup and dipping a stick into the urine or putting some of the urine into a special container with an eyedropper.  Others are done by placing a stick into your urine stream.  Tests vary in how long you need to wait for the stick or container to turn a certain color or have a symbol on it (like a plus or a minus).  All tests come with written instructions. Most tests also have toll-free phone numbers to call if you have any questions about how to do the test or read the results. HOW ACCURATE ARE HOME PREGNANCY TESTS?  HPTs are very accurate. Most brands of HPTs say they are 97% to 99% accurate when taken 1 week after missing your menstrual period, but this can vary with actual use. Each brand varies in how sensitive it is in picking up the pregnancy hormone hCG. If a test is not done correctly, it will be less accurate. Always check the package to make sure it is not past its expiration date. If it is, it will not be accurate. Most brands of HPTs tell users to do the test again in a few days, no matter what the results.  If you use an HPT too early in your pregnancy, you may not have enough of the pregnancy hormone hCG in your urine to have a positive test result. Most HPTs will be accurate if you test yourself around the time your period is due (about 2 weeks after you ovulate). You can get a negative test result if you are not pregnant or if you ovulated later than you thought you did. You may also have problems with the  pregnancy, which affects the amount of hCG you have in your urine. If your HPT is negative, test yourself again within a few days to 1 week. If you keep getting a negative result and think you are pregnant, talk with your caregiver right away about getting a blood pregnancy test.  FALSE POSITIVE PREGNANCY TEST A false positive HPT can happen if there is blood or protein present in your urine. A false positive can also happen if you were recently pregnant or if you take a pregnancy test too soon after taking fertility drug that contains hCG. Also, some prescription medicines such as water pills (diuretics), tranquilizers, seizure medicines, psychiatric medicines, and allergy and nausea medicines (promethazine) give false positive readings. FALSE NEGATIVE PREGNANCY TEST  A false negative HPT can  happen if you do the test too early. Try to wait until you are at least 1 day late for your menstrual period.  It may happen if you wait too long to test the urine (longer than 15 minutes).  It may also happen if the urine is too diluted because you drank a lot of fluids before getting the urine sample. It is best to test the first morning urine after you get out of bed. If your menstrual period did not start after a week of a negative HPT, repeat the pregnancy test. CAN ANYTHING INTERFERE WITH HOME PREGNANCY TEST RESULTS?  Most medicines, both over-the-counter and prescription drugs, including birth control pills and antibiotics, should not affect the results of a HPT. Only those drugs that have the pregnancy hormone hCG in them can give a false positive test result. Drugs that have hCG in them may be used for treating infertility (not being able to get pregnant). Alcohol and illegal drugs do not affect HPT results, but you should not be using these substances if you are trying to get pregnant. If you have a positive pregnancy test, call your caregiver to make an appointment to begin prenatal care. Document  Released: 12/02/2003 Document Revised: 02/21/2012 Document Reviewed: 03/15/2014 [facility]Exit[facility] Patient Information 2015 [place]ExitCare, [place]. This information is not intended to replace advice given to you by your health care provider. Make sure you discuss any questions you have with your health care provider.

## 2014-10-18 NOTE — MAU Note (Signed)
I accidentally drank after someone who had gotten live flu vaccine on Weds. Weds night started with cough, fatigue, felt like may have had fever but did not take temp. Possibly pregnant. Late on starting period

## 2015-08-06 ENCOUNTER — Emergency Department (HOSPITAL_COMMUNITY)
Admission: EM | Admit: 2015-08-06 | Discharge: 2015-08-06 | Disposition: A | Discharge status: Home / Self Care | Payer: Medicaid Other | Attending: Emergency Medicine | Admitting: Emergency Medicine

## 2015-08-06 ENCOUNTER — Encounter (HOSPITAL_COMMUNITY): Payer: Self-pay | Admitting: *Deleted

## 2015-08-06 DIAGNOSIS — E669 Obesity, unspecified: Secondary | ICD-10-CM | POA: Insufficient documentation

## 2015-08-06 DIAGNOSIS — B9689 Other specified bacterial agents as the cause of diseases classified elsewhere: Secondary | ICD-10-CM

## 2015-08-06 DIAGNOSIS — N760 Acute vaginitis: Secondary | ICD-10-CM | POA: Insufficient documentation

## 2015-08-06 DIAGNOSIS — Z87891 Personal history of nicotine dependence: Secondary | ICD-10-CM | POA: Insufficient documentation

## 2015-08-06 DIAGNOSIS — Z8619 Personal history of other infectious and parasitic diseases: Secondary | ICD-10-CM | POA: Insufficient documentation

## 2015-08-06 DIAGNOSIS — F319 Bipolar disorder, unspecified: Secondary | ICD-10-CM | POA: Insufficient documentation

## 2015-08-06 DIAGNOSIS — Z79899 Other long term (current) drug therapy: Secondary | ICD-10-CM | POA: Insufficient documentation

## 2015-08-06 DIAGNOSIS — Z202 Contact with and (suspected) exposure to infections with a predominantly sexual mode of transmission: Secondary | ICD-10-CM | POA: Insufficient documentation

## 2015-08-06 DIAGNOSIS — F419 Anxiety disorder, unspecified: Secondary | ICD-10-CM | POA: Insufficient documentation

## 2015-08-06 LAB — I-STAT BETA HCG BLOOD, ED (MC, WL, AP ONLY): I-stat hCG, quantitative: <5 m[IU]/mL (ref <5)

## 2015-08-06 LAB — WET PREP, GENITAL
Trich, Wet Prep: NONE SEEN
Yeast Wet Prep HPF POC: NONE SEEN

## 2015-08-06 MED ORDER — METRONIDAZOLE 500 MG PO TABS: 500.0000 mg | ORAL_TABLET | Freq: Two times a day (BID) | ORAL | Status: DC

## 2015-08-06 MED ORDER — AZITHROMYCIN 250 MG PO TABS
1000.0000 mg | ORAL_TABLET | Freq: Once | ORAL | Status: AC
  Administered 2015-08-06: 1000 mg via ORAL
  Filled 2015-08-06: qty 4

## 2015-08-06 MED ORDER — METRONIDAZOLE 500 MG PO TABS
500.0000 mg | ORAL_TABLET | Freq: Once | ORAL | Status: AC
  Administered 2015-08-06: 500 mg via ORAL
  Filled 2015-08-06: qty 1

## 2015-08-06 MED ORDER — LIDOCAINE HCL (PF) 1 % IJ SOLN
2.0000 mL | Freq: Once | INTRAMUSCULAR | Status: AC
  Administered 2015-08-06: 2 mL
  Filled 2015-08-06: qty 5

## 2015-08-06 MED ORDER — CEFTRIAXONE SODIUM 250 MG IJ SOLR
250.0000 mg | Freq: Once | INTRAMUSCULAR | Status: AC
  Administered 2015-08-06: 250 mg via INTRAMUSCULAR
  Filled 2015-08-06: qty 250

## 2015-08-06 NOTE — ED Provider Notes (Signed)
CSN: [id number]     Arrival date & time 08/06/15  1755 History   First MD Initiated Contact with Patient 08/06/15 1912     Chief Complaint  Patient presents with  . Exposure to STD     (Consider location/radiation/quality/duration/timing/severity/associated sxs/prior Treatment) HPI   Blood pressure 136/86, pulse 89, temperature 98.3 F (36.8 C), temperature source Oral, resp. rate 18, last menstrual period 07/20/2015, SpO2 98 %.  Audrey Collins is a 24 y.o. female presenting for STD check. Patient states her boyfriend was diagnosed with gonorrhea approximately 5 days ago, she's had unprotected sex with him. She reports a thin, white, non-. No vaginal discharge in addition to lower abdominal pain with dyspareunia worsening over the last 2 days. She denies fever, chills, nausea or vomiting.  Past Medical History  Diagnosis Date  . Placenta previa marginalis     resolved per pt  . Anxiety   . Short cervix affecting pregnancy     took prometrium, resolved  . Chlamydia infection complicating pregnancy   . Obesity   . Bipolar 1 disorder   . Pregnancy induced hypertension    Past Surgical History  Procedure Laterality Date  . No past surgeries     Family History  Problem Relation Age of Onset  . Hypertension Father   . Thyroid disease Mother   . Diabetes Paternal Grandfather   . Diabetes Maternal Grandfather    Social History  Substance Use Topics  . Smoking status: Former Smoker -- 0.25 packs/day for .5 years    Quit date: 10/13/2012  . Smokeless tobacco: Never Used  . Alcohol Use: No     Comment: none since finding out pregnant   OB History    Gravida Para Term Preterm AB TAB SAB Ectopic Multiple Living   Review of Systems  10 systems reviewed and found to be negative, except as noted in the HPI.   Allergies  Review of patient's allergies indicates no known allergies.  Home Medications   Prior to Admission medications   Medication Sig  Start Date End Date Taking? Authorizing Provider  citalopram (CELEXA) 20 MG tablet Take 1 tablet (20 mg total) by mouth daily. 09/12/13   Tommie Sams, DO  lamoTRIgine (LAMICTAL) 25 MG tablet Take 25 mg by mouth daily. Has not gotten script filled yet    Historical Provider, MD  Prenatal Vit-Fe Fumarate-FA (PRENATAL MULTIVITAMIN) TABS Take 1 tablet by mouth daily.    Historical Provider, MD  promethazine-dextromethorphan (PROMETHAZINE-DM) 6.25-15 MG/5ML syrup Take 5 mLs by mouth 4 (four) times daily as needed for cough. 10/18/14   Harolyn Rutherford Rasch, NP   BP 136/86 mmHg  Pulse 89  Temp(Src) 98.3 F (36.8 C) (Oral)  Resp 18  SpO2 98%  LMP 07/20/2015 Physical Exam  Constitutional: She is oriented to person, place, and time. She appears well-developed and well-nourished. No distress.  HENT:  Head: Normocephalic.  Eyes: Conjunctivae and EOM are normal.  Cardiovascular: Normal rate.   Pulmonary/Chest: Effort normal. No stridor.  Abdominal: Soft. Bowel sounds are normal. She exhibits no distension and no mass. There is no tenderness. There is no rebound and no guarding.  Genitourinary:  Pelvic exam a chaperoned by nurse: No rashes or lesion, there is no abnormal vaginal discharge, no cervical or adnexal tenderness.  Musculoskeletal: Normal range of motion.  Neurological: She is alert and oriented to person, place, and time.  Psychiatric: She  has a normal mood and affect.  Nursing note and vitals reviewed.   ED Course  Procedures (including critical care time) Labs Review Labs Reviewed  WET PREP, GENITAL  RPR  HIV ANTIBODY (ROUTINE TESTING)  I-STAT BETA HCG BLOOD, ED ([facility], [facility], [facility] ONLY)  GC/CHLAMYDIA PROBE AMP ([place]) NOT AT [facility]    Imaging Review No results found. I have personally reviewed and evaluated these images and lab results as part of my medical decision-making.   EKG Interpretation None      MDM   Final diagnoses:  Exposure to STD  BV (bacterial vaginosis)     Filed Vitals:   08/06/15 1814  BP: 136/86  Pulse: 89  Temp: 98.3 F (36.8 C)  TempSrc: Oral  Resp: 18  SpO2: 98%    Medications  cefTRIAXone (ROCEPHIN) injection 250 mg (not administered)  azithromycin (ZITHROMAX) tablet 1,000 mg (not administered)  lidocaine (PF) (XYLOCAINE) 1 % injection 2 mL (not administered)    [name] is a pleasant 24 y.o. female presenting with closure to gonorrhea. Patient is having symptoms of lower abdominal pain abnormal vaginal discharge and dyspareunia. On my exam there is no signs of PID. Patient will be treated for cervicitis with Rocephin and Zithromax, I've explained to her that these medications will take at least 2 a week to eradicate the infection and that she is to abstain from sex or have very well protected barrier sex in the meantime.  Evaluation does not show pathology that would require ongoing emergent intervention or inpatient treatment. Pt is hemodynamically stable and mentating appropriately. Discussed findings and plan with patient/guardian, who agrees with care plan. All questions answered. Return precautions discussed and outpatient follow up given.   New Prescriptions   METRONIDAZOLE (FLAGYL) 500 MG TABLET    Take 1 tablet (500 mg total) by mouth 2 (two) times daily. One tab PO bid x 10 days         [name], PA-C 08/06/15 2021  Gilda Crease, MD 08/06/15 2023

## 2015-08-06 NOTE — Discharge Instructions (Signed)
You were not tested for all STDs today. Your gonorrhea and chlamydia tests are pending- if they are positive, you will receive a phone call. Refrain from sex until you have the results from a full STD screen. Please go to [facility] (Address: [address], [place], [place] [address] Phone: [phone]) or see [facility] (Address: [address]. Phone: [phone]) for full STD screening. Return to the emergency room for worsening of symptoms, fever, and vomiting.   Take your antibiotics as directed and to completion. You should never have any leftover antibiotics! Push fluids and stay well hydrated.   Any antibiotic use can reduce the efficacy of hormonal birth control. Please use back up method of contraception.   Do not drink alcohol while you are taking flagyl (metronidazole) because it will make you very sick.  Please follow with your primary care doctor in the next 2 days for a check-up. They must obtain records for further management.   Do not hesitate to return to the Emergency Department for any new, worsening or concerning symptoms.    Gonorrhea Gonorrhea is an infection that can cause serious problems. If left untreated, the infection may:   Damage the female or female organs.   Cause women to be unable to have children (sterility).   Harm a fetus if the infected woman is pregnant.  It is important to get treatment for gonorrhea as soon as possible. It is also necessary that all your sexual partners be tested for the infection.  CAUSES  Gonorrhea is caused by bacteria called Neisseria gonorrhoeae. The infection is spread from person to person, usually by sexual contact (such as by anal, vaginal, or oral means). A newborn can contract the infection from his or her mother during birth.  SYMPTOMS  Some people with gonorrhea do not have symptoms. Symptoms may be different in females and males.  Females The most common symptoms are:    Pain in the lower abdomen.   Fever with or without chills.  Other symptoms include:   Abnormal vaginal discharge.   Painful intercourse.   Burning or itching of the vagina or lips of the vagina.   Abnormal vaginal bleeding.   Pain when urinating.   Long-lasting (chronic) pain in the lower abdomen, especially during menstruation or intercourse.   Inability to become pregnant.   Going into premature labor.   Irritation, pain, bleeding, or discharge from the rectum. This may occur if the infection was spread by anal sex.   Sore throat or swollen lymph nodes in the neck. This may occur if the infection was spread by oral sex.  Males The most common symptoms are:   Discharge from the penis.   Pain or burning during urination.   Pain or swelling in the testicles. Other symptoms may include:   Irritation, pain, bleeding, or discharge from the rectum. This may occur if the infection was spread by anal sex.   Sore throat, fever, or swollen lymph nodes in the neck. This may occur if the infection was spread by oral sex.  DIAGNOSIS  A diagnosis is made after a physical exam is done and a sample of discharge is examined under a microscope for the presence of the bacteria. The discharge may be taken from the urethra, cervix, throat, or rectum.  TREATMENT  Gonorrhea is treated with antibiotic medicines. It is important for treatment to begin as soon as possible. Early treatment may prevent some problems from developing.  HOME CARE INSTRUCTIONS  Take medicines only as directed by your health care provider.   Take your antibiotic medicine as directed by your health care provider. Finish the antibiotic even if you start to feel better. Incomplete treatment will put you at risk for continued infection.   Do not have sex until treatment is complete or as directed by your health care provider.   Keep all follow-up visits as directed by your health care provider.    Not all test results are available during your visit. If your test results are not back during the visit, make an appointment with your health care provider to find out the results. Do not assume everything is normal if you have not heard from your health care provider or the medical facility. It is your responsibility to get your test results.  If you test positive for gonorrhea, inform your recent sexual partners. They need to be checked for gonorrhea even if they do not have symptoms. They may need treatment, even if they test negative for gonorrhea.  SEEK MEDICAL CARE IF:   You develop any bad reaction to the medicine you were prescribed. This may include:   A rash.   Nausea.   Vomiting.   Diarrhea.   Your symptoms do not improve after a few days of taking antibiotics.   Your symptoms get worse.   You develop increased pain, such as in the testicles (for males) or in the abdomen (for females).  You have a fever. MAKE SURE YOU:   Understand these instructions.  Will watch your condition.  Will get help right away if you are not doing well or get worse. Document Released: 11/26/2000 Document Revised: 04/15/2014 Document Reviewed: 06/06/2013 [facility] Patient Information 2015 [place], [place]. This information is not intended to replace advice given to you by your health care provider. Make sure you discuss any questions you have with your health care provider.

## 2015-08-06 NOTE — ED Notes (Signed)
Pt requesting treatment for STD. Reports partner was recently diagnosed with gonorrhea. Pt c/o vag discharge and discomfort during intercourse

## 2015-08-06 NOTE — ED Notes (Signed)
Pt reports being exposed to gonorrhea and now having pain and vaginal discharge. No acute distress noted.

## 2015-08-07 LAB — GC/CHLAMYDIA PROBE AMP (~~LOC~~) NOT AT ARMC
Chlamydia: NEGATIVE
NEISSERIA GONORRHEA: POSITIVE — AB

## 2015-08-07 LAB — HIV ANTIBODY (ROUTINE TESTING W REFLEX): HIV SCREEN 4TH GENERATION: NONREACTIVE

## 2015-08-07 LAB — RPR: RPR: NONREACTIVE

## 2015-08-08 ENCOUNTER — Telehealth (HOSPITAL_BASED_OUTPATIENT_CLINIC_OR_DEPARTMENT_OTHER): Payer: Self-pay | Admitting: Emergency Medicine

## 2015-08-09 ENCOUNTER — Telehealth (HOSPITAL_COMMUNITY): Payer: Self-pay | Admitting: Emergency Medicine

## 2015-08-09 NOTE — Telephone Encounter (Signed)
ID verified x three. Patient notified  of positive Gonorrhea and that treatment was given while in ED. STD instructions provided, patient verbalized understanding.

## 2015-10-27 ENCOUNTER — Encounter (HOSPITAL_COMMUNITY): Payer: Self-pay

## 2015-10-27 ENCOUNTER — Inpatient Hospital Stay (HOSPITAL_COMMUNITY)
Admission: AD | Admit: 2015-10-27 | Discharge: 2015-10-28 | Disposition: A | Discharge status: Home / Self Care | Payer: Self-pay | Source: Ambulatory Visit | Attending: Family Medicine | Admitting: Family Medicine

## 2015-10-27 ENCOUNTER — Inpatient Hospital Stay (HOSPITAL_COMMUNITY)
Admission: AD | Admit: 2015-10-27 | Discharge: 2015-10-27 | Discharge status: Against Medical Advice | Payer: Medicaid Other | Source: Ambulatory Visit | Attending: Family Medicine | Admitting: Family Medicine

## 2015-10-27 ENCOUNTER — Inpatient Hospital Stay (HOSPITAL_COMMUNITY): Payer: Medicaid Other

## 2015-10-27 ENCOUNTER — Encounter (HOSPITAL_COMMUNITY): Payer: Self-pay | Admitting: *Deleted

## 2015-10-27 DIAGNOSIS — F319 Bipolar disorder, unspecified: Secondary | ICD-10-CM | POA: Insufficient documentation

## 2015-10-27 DIAGNOSIS — Z3A14 14 weeks gestation of pregnancy: Secondary | ICD-10-CM | POA: Insufficient documentation

## 2015-10-27 DIAGNOSIS — Z539 Procedure and treatment not carried out, unspecified reason: Secondary | ICD-10-CM | POA: Insufficient documentation

## 2015-10-27 DIAGNOSIS — O26891 Other specified pregnancy related conditions, first trimester: Secondary | ICD-10-CM

## 2015-10-27 DIAGNOSIS — Z202 Contact with and (suspected) exposure to infections with a predominantly sexual mode of transmission: Secondary | ICD-10-CM

## 2015-10-27 DIAGNOSIS — Z87891 Personal history of nicotine dependence: Secondary | ICD-10-CM | POA: Insufficient documentation

## 2015-10-27 DIAGNOSIS — O3680X0 Pregnancy with inconclusive fetal viability, not applicable or unspecified: Secondary | ICD-10-CM

## 2015-10-27 DIAGNOSIS — O2691 Pregnancy related conditions, unspecified, first trimester: Secondary | ICD-10-CM | POA: Insufficient documentation

## 2015-10-27 DIAGNOSIS — R102 Pelvic and perineal pain: Secondary | ICD-10-CM | POA: Insufficient documentation

## 2015-10-27 DIAGNOSIS — O132 Gestational [pregnancy-induced] hypertension without significant proteinuria, second trimester: Secondary | ICD-10-CM | POA: Insufficient documentation

## 2015-10-27 LAB — URINALYSIS, ROUTINE W REFLEX MICROSCOPIC
Bilirubin Urine: NEGATIVE
Glucose, UA: NEGATIVE mg/dL
Hgb urine dipstick: NEGATIVE
KETONES UR: NEGATIVE mg/dL
NITRITE: NEGATIVE
PH: 5.5 (ref 5.0–8.0)
Protein, ur: NEGATIVE mg/dL
Specific Gravity, Urine: 1.02 (ref 1.005–1.030)
Urobilinogen, UA: 0.2 mg/dL (ref 0.0–1.0)

## 2015-10-27 LAB — CBC
HCT: 44.3 % (ref 36.0–46.0)
Hemoglobin: 14.8 g/dL (ref 12.0–15.0)
MCH: 31.7 pg (ref 26.0–34.0)
MCHC: 33.4 g/dL (ref 30.0–36.0)
MCV: 94.9 fL (ref 78.0–100.0)
PLATELETS: 209 10*3/uL (ref 150–400)
RBC: 4.67 MIL/uL (ref 3.87–5.11)
RDW: 13.4 % (ref 11.5–15.5)
WBC: 11 10*3/uL — ABNORMAL HIGH (ref 4.0–10.5)

## 2015-10-27 LAB — HCG, QUANTITATIVE, PREGNANCY: HCG, BETA CHAIN, QUANT, S: 299 m[IU]/mL — AB (ref <5)

## 2015-10-27 LAB — URINE MICROSCOPIC-ADD ON

## 2015-10-27 LAB — POCT PREGNANCY, URINE: Preg Test, Ur: POSITIVE — AB

## 2015-10-27 LAB — WET PREP, GENITAL
CLUE CELLS WET PREP: NONE SEEN
Trich, Wet Prep: NONE SEEN
YEAST WET PREP: NONE SEEN

## 2015-10-27 MED ORDER — AZITHROMYCIN 250 MG PO TABS
1000.0000 mg | ORAL_TABLET | Freq: Once | ORAL | Status: AC
  Administered 2015-10-27: 1000 mg via ORAL
  Filled 2015-10-27: qty 4

## 2015-10-27 MED ORDER — CEFTRIAXONE SODIUM 250 MG IJ SOLR
250.0000 mg | Freq: Once | INTRAMUSCULAR | Status: AC
  Administered 2015-10-27: 250 mg via INTRAMUSCULAR
  Filled 2015-10-27: qty 250

## 2015-10-27 NOTE — MAU Provider Note (Signed)
History     CSN: [id number]  Arrival date and time: 10/27/15 [id number]   First Provider Initiated Contact with Patient 10/27/15 2227      Chief Complaint  Patient presents with  . Possible Pregnancy   HPI Comments: LMP 09/20/15   Audrey Collins is a 24 y.o. G2P1001 at 7360w2d who present today with pelvic pain. She states that she was treated for gonorrhea a few weeks ago. Her partner was treated, but they had intercourse between when she was treated and when he was treated. Neither have been re-tested or re-treated.   Pelvic Pain The patient's primary symptoms include pelvic pain. This is a new problem. The current episode started in the past 7 days. The problem occurs intermittently. The problem has been waxing and waning. The pain is mild (4/10). The problem affects both sides. She is pregnant. Associated symptoms include abdominal pain and nausea. Pertinent negatives include no chills, constipation, diarrhea, dysuria, fever, frequency, urgency or vomiting. The vaginal discharge was normal. There has been no bleeding. She has not been passing clots. She has not been passing tissue. Nothing aggravates the symptoms. She has tried nothing for the symptoms. The treatment provided no relief. She is sexually active. Yes, her partner has an STD. She uses nothing for contraception.    Past Medical History  Diagnosis Date  . Placenta previa marginalis     resolved per pt  . Anxiety   . Short cervix affecting pregnancy     took prometrium, resolved  . Chlamydia infection complicating pregnancy   . Obesity   . Bipolar 1 disorder (HCC)   . Pregnancy induced hypertension     Past Surgical History  Procedure Laterality Date  . No past surgeries      Family History  Problem Relation Age of Onset  . Hypertension Father   . Thyroid disease Mother   . Diabetes Paternal Grandfather   . Diabetes Maternal Grandfather     Social History  Substance Use Topics  . Smoking status: Former  Smoker -- 0.25 packs/day for .5 years    Quit date: 10/13/2012  . Smokeless tobacco: Never Used  . Alcohol Use: No     Comment: none since finding out pregnant    Allergies: No Known Allergies  Prescriptions prior to admission  Medication Sig Dispense Refill Last Dose  . citalopram (CELEXA) 20 MG tablet Take 1 tablet (20 mg total) by mouth daily. 30 tablet 1   . lamoTRIgine (LAMICTAL) 25 MG tablet Take 25 mg by mouth daily. Has not gotten script filled yet     . metroNIDAZOLE (FLAGYL) 500 MG tablet Take 1 tablet (500 mg total) by mouth 2 (two) times daily. One tab PO bid x 10 days 20 tablet 0   . Prenatal Vit-Fe Fumarate-FA (PRENATAL MULTIVITAMIN) TABS Take 1 tablet by mouth daily.   Not Taking  . promethazine-dextromethorphan (PROMETHAZINE-DM) 6.25-15 MG/5ML syrup Take 5 mLs by mouth 4 (four) times daily as needed for cough. 118 mL 0     Review of Systems  Constitutional: Negative for fever and chills.  Gastrointestinal: Positive for nausea and abdominal pain. Negative for vomiting, diarrhea and constipation.  Genitourinary: Positive for pelvic pain. Negative for dysuria, urgency and frequency.   Physical Exam   Blood pressure 149/99, pulse 84, temperature 98.1 F (36.7 C), temperature source Oral, resp. rate 20, height 5\' 8"  (1.727 m), weight 93.078 kg (205 lb 3.2 oz), last menstrual period 09/20/2015, SpO2 100 %.  Physical Exam  Nursing note and vitals reviewed. Constitutional: She is oriented to person, place, and time. She appears well-developed and well-nourished. No distress.  HENT:  Head: Normocephalic.  Cardiovascular: Normal rate.   Respiratory: Effort normal.  GI: Soft. There is no tenderness.  Genitourinary:   External: no lesion Vagina: small amount of white discharge Cervix: pink, smooth, no CMT Uterus: NSSC Adnexa: NT   Neurological: She is alert and oriented to person, place, and time.  Skin: Skin is warm and dry.  Psychiatric: She has a normal mood and  affect.   Results for orders placed or performed during the hospital encounter of 10/27/15 (from the past 24 hour(s))  CBC     Status: Abnormal   Collection Time: 10/27/15  9:04 PM  Result Value Ref Range   WBC 11.0 (H) 4.0 - 10.5 K/uL   RBC 4.67 3.87 - 5.11 MIL/uL   Hemoglobin 14.8 12.0 - 15.0 g/dL   HCT 16.1 09.6 - 04.5 %   MCV 94.9 78.0 - 100.0 fL   MCH 31.7 26.0 - 34.0 pg   MCHC 33.4 30.0 - 36.0 g/dL   RDW 40.9 81.1 - 91.4 %   Platelets 209 150 - 400 K/uL  hCG, quantitative, pregnancy     Status: Abnormal   Collection Time: 10/27/15  9:04 PM  Result Value Ref Range   hCG, Beta Chain, Quant, S 299 (H) <5 mIU/mL   US Ob Comp Less 14 Wks  10/27/2015  CLINICAL DATA:  First trimester pregnancy with pelvic pain. Gestational age by LMP 5 weeks 2 days. EXAM: OBSTETRIC <14 WK [place] AND TRANSVAGINAL OB US TECHNIQUE: Both transabdominal and transvaginal ultrasound examinations were performed for complete evaluation of the gestation as well as the maternal uterus, adnexal regions, and pelvic cul-de-sac. Transvaginal technique was performed to assess early pregnancy. COMPARISON:  None. FINDINGS: Intrauterine gestational sac: Questionable 3-4 mm gestational sac, not definite. Yolk sac:  Not visible Embryo:  Not visible MSD: 3.3  mm   5 w   0  d Maternal uterus/adnexae: Both ovaries are normal. Suspected corpus luteum on the left. No evidence of subchorionic hemorrhage. Trace amount of free fluid, not pathologic. IMPRESSION: Probable early intrauterine gestational sac, but no yolk sac, fetal pole, or cardiac activity yet visualized. Recommend follow-up quantitative B-HCG levels and follow-up US in 14 days to confirm and assess viability. This recommendation follows SRU consensus guidelines: Diagnostic Criteria for Nonviable Pregnancy Early in the First Trimester. Malva Limes Med 2013; 782:9562-13. Electronically Signed   By: Paulina Fusi M.D.   On: 10/27/2015 23:40   US Ob Transvaginal  10/27/2015  CLINICAL  DATA:  First trimester pregnancy with pelvic pain. Gestational age by LMP 5 weeks 2 days. EXAM: OBSTETRIC <14 WK [place] AND TRANSVAGINAL OB US TECHNIQUE: Both transabdominal and transvaginal ultrasound examinations were performed for complete evaluation of the gestation as well as the maternal uterus, adnexal regions, and pelvic cul-de-sac. Transvaginal technique was performed to assess early pregnancy. COMPARISON:  None. FINDINGS: Intrauterine gestational sac: Questionable 3-4 mm gestational sac, not definite. Yolk sac:  Not visible Embryo:  Not visible MSD: 3.3  mm   5 w   0  d Maternal uterus/adnexae: Both ovaries are normal. Suspected corpus luteum on the left. No evidence of subchorionic hemorrhage. Trace amount of free fluid, not pathologic. IMPRESSION: Probable early intrauterine gestational sac, but no yolk sac, fetal pole, or cardiac activity yet visualized. Recommend follow-up quantitative B-HCG levels and follow-up US in 14 days to confirm  and assess viability. This recommendation follows SRU consensus guidelines: Diagnostic Criteria for Nonviable Pregnancy Early in the First Trimester. Malva Limes Med 2013; 086:5784-69. Electronically Signed   By: Paulina Fusi M.D.   On: 10/27/2015 23:40     MAU Course  Procedures  MDM Treated here in MAU with 250 mg rocephin and 1000 mg Azithromycin   Assessment and Plan   1. Pelvic pain affecting pregnancy in first trimester, antepartum   2. Exposure to sexually transmitted disease (STD)   3. Pregnancy of unknown anatomic location    DC home Comfort measures reviewed  1st Trimester precautions  Bleeding precautions Ectopic precautions RX: none  Return to MAU as needed FU with OB as planned  Follow-up Information    Follow up with [facility] In 2 days.   Why:  10/29/15 in the evening    Contact information:   [address] [id number] mc [place] [address] [phone]       Tawnya Crook 10/27/2015, 10:31 PM

## 2015-10-27 NOTE — MAU Note (Signed)
Pt not in lobby.  

## 2015-10-27 NOTE — MAU Note (Signed)
Pt states that had +upt at home. Having some mild cramping but no bleeding. Also having some nausea heartburn but no vomiting. LMP: 09/20/2015.

## 2015-10-27 NOTE — Discharge Instructions (Signed)

## 2015-10-28 LAB — GC/CHLAMYDIA PROBE AMP (~~LOC~~) NOT AT ARMC
CHLAMYDIA, DNA PROBE: NEGATIVE
NEISSERIA GONORRHEA: POSITIVE — AB

## 2015-10-28 LAB — HIV ANTIBODY (ROUTINE TESTING W REFLEX): HIV SCREEN 4TH GENERATION: NONREACTIVE

## 2015-10-28 LAB — RPR: RPR Ser Ql: NONREACTIVE

## 2015-10-31 ENCOUNTER — Telehealth (HOSPITAL_COMMUNITY): Payer: Self-pay | Admitting: Advanced Practice Midwife

## 2015-10-31 NOTE — Telephone Encounter (Signed)
Telephone call to patient regarding positive GC culture.  Patient presented with known re-exposure and was treated at time of visit.  Patient confirmed that she has not has sex since treatment.  Report of treatment faxed to health department.

## 2015-11-19 ENCOUNTER — Encounter (HOSPITAL_COMMUNITY): Payer: Self-pay | Admitting: *Deleted

## 2015-11-19 ENCOUNTER — Inpatient Hospital Stay (HOSPITAL_COMMUNITY)
Admission: AD | Admit: 2015-11-19 | Discharge: 2015-11-19 | Disposition: A | Discharge status: Home / Self Care | Payer: Self-pay | Source: Ambulatory Visit | Attending: Obstetrics and Gynecology | Admitting: Obstetrics and Gynecology

## 2015-11-19 ENCOUNTER — Inpatient Hospital Stay (HOSPITAL_COMMUNITY): Payer: Medicaid Other

## 2015-11-19 DIAGNOSIS — Z202 Contact with and (suspected) exposure to infections with a predominantly sexual mode of transmission: Secondary | ICD-10-CM | POA: Insufficient documentation

## 2015-11-19 DIAGNOSIS — O26891 Other specified pregnancy related conditions, first trimester: Secondary | ICD-10-CM | POA: Insufficient documentation

## 2015-11-19 DIAGNOSIS — O26899 Other specified pregnancy related conditions, unspecified trimester: Secondary | ICD-10-CM

## 2015-11-19 DIAGNOSIS — Z3A08 8 weeks gestation of pregnancy: Secondary | ICD-10-CM | POA: Insufficient documentation

## 2015-11-19 DIAGNOSIS — Z3491 Encounter for supervision of normal pregnancy, unspecified, first trimester: Secondary | ICD-10-CM

## 2015-11-19 DIAGNOSIS — R109 Unspecified abdominal pain: Secondary | ICD-10-CM | POA: Insufficient documentation

## 2015-11-19 DIAGNOSIS — O9989 Other specified diseases and conditions complicating pregnancy, childbirth and the puerperium: Secondary | ICD-10-CM

## 2015-11-19 HISTORY — DX: Gonococcal infection, unspecified: A54.9

## 2015-11-19 LAB — WET PREP, GENITAL
SPERM: NONE SEEN
Trich, Wet Prep: NONE SEEN
YEAST WET PREP: NONE SEEN

## 2015-11-19 LAB — CBC
HEMATOCRIT: 42.9 % (ref 36.0–46.0)
HEMOGLOBIN: 14.4 g/dL (ref 12.0–15.0)
MCH: 31.4 pg (ref 26.0–34.0)
MCHC: 33.6 g/dL (ref 30.0–36.0)
MCV: 93.5 fL (ref 78.0–100.0)
Platelets: 208 10*3/uL (ref 150–400)
RBC: 4.59 MIL/uL (ref 3.87–5.11)
RDW: 13 % (ref 11.5–15.5)
WBC: 8.8 10*3/uL (ref 4.0–10.5)

## 2015-11-19 LAB — HCG, QUANTITATIVE, PREGNANCY: hCG, Beta Chain, Quant, S: 42040 m[IU]/mL — ABNORMAL HIGH (ref <5)

## 2015-11-19 MED ORDER — AZITHROMYCIN 250 MG PO TABS
1000.0000 mg | ORAL_TABLET | Freq: Once | ORAL | Status: AC
Start: 2015-11-19 — End: 2015-11-19
  Administered 2015-11-19: 1000 mg via ORAL
  Filled 2015-11-19: qty 4

## 2015-11-19 MED ORDER — PROMETHAZINE HCL 25 MG PO TABS: 25.0000 mg | ORAL_TABLET | Freq: Four times a day (QID) | ORAL | Status: AC | PRN

## 2015-11-19 MED ORDER — CEFTRIAXONE SODIUM 250 MG IJ SOLR
250.0000 mg | Freq: Once | INTRAMUSCULAR | Status: AC
  Administered 2015-11-19: 250 mg via INTRAMUSCULAR
  Filled 2015-11-19: qty 250

## 2015-11-19 NOTE — MAU Note (Signed)
Urine in lab 

## 2015-11-19 NOTE — MAU Note (Addendum)
Was here a few wks ago, preg confirmed, was given ectopic precautions, was to have returned 2 days later for follow up.  Was unable.  Has had cramping off and on, usually follows when she has a BM. Did not go to health dept to get treated.  Had some spotting over the weekend, just one night (red)

## 2015-11-19 NOTE — Discharge Instructions (Signed)
Do not have intercourse until 7 days AFTER both you and your partner have been treated    First Trimester of Pregnancy The first trimester of pregnancy is from week 1 until the end of week 12 (months 1 through 3). A week after a sperm fertilizes an egg, the egg will implant on the wall of the uterus. This embryo will begin to develop into a baby. Genes from you and your partner are forming the baby. The female genes determine whether the baby is a boy or a girl. At 6-8 weeks, the eyes and face are formed, and the heartbeat can be seen on ultrasound. At the end of 12 weeks, all the baby's organs are formed.  Now that you are pregnant, you will want to do everything you can to have a healthy baby. Two of the most important things are to get good prenatal care and to follow your health care provider's instructions. Prenatal care is all the medical care you receive before the baby's birth. This care will help prevent, find, and treat any problems during the pregnancy and childbirth. BODY CHANGES Your body goes through many changes during pregnancy. The changes vary from woman to woman.   You may gain or lose a couple of pounds at first.  You may feel sick to your stomach (nauseous) and throw up (vomit). If the vomiting is uncontrollable, call your health care provider.  You may tire easily.  You may develop headaches that can be relieved by medicines approved by your health care provider.  You may urinate more often. Painful urination may mean you have a bladder infection.  You may develop heartburn as a result of your pregnancy.  You may develop constipation because certain hormones are causing the muscles that push waste through your intestines to slow down.  You may develop hemorrhoids or swollen, bulging veins (varicose veins).  Your breasts may begin to grow larger and become tender. Your nipples may stick out more, and the tissue that surrounds them (areola) may become darker.  Your gums  may bleed and may be sensitive to brushing and flossing.  Dark spots or blotches (chloasma, mask of pregnancy) may develop on your face. This will likely fade after the baby is born.  Your menstrual periods will stop.  You may have a loss of appetite.  You may develop cravings for certain kinds of food.  You may have changes in your emotions from day to day, such as being excited to be pregnant or being concerned that something may go wrong with the pregnancy and baby.  You may have more vivid and strange dreams.  You may have changes in your hair. These can include thickening of your hair, rapid growth, and changes in texture. Some women also have hair loss during or after pregnancy, or hair that feels dry or thin. Your hair will most likely return to normal after your baby is born. WHAT TO EXPECT AT YOUR PRENATAL VISITS During a routine prenatal visit:  You will be weighed to make sure you and the baby are growing normally.  Your blood pressure will be taken.  Your abdomen will be measured to track your baby's growth.  The fetal heartbeat will be listened to starting around week 10 or 12 of your pregnancy.  Test results from any previous visits will be discussed. Your health care provider may ask you:  How you are feeling.  If you are feeling the baby move.  If you have had any abnormal  symptoms, such as leaking fluid, bleeding, severe headaches, or abdominal cramping.  If you are using any tobacco products, including cigarettes, chewing tobacco, and electronic cigarettes.  If you have any questions. Other tests that may be performed during your first trimester include:  Blood tests to find your blood type and to check for the presence of any previous infections. They will also be used to check for low iron levels (anemia) and Rh antibodies. Later in the pregnancy, blood tests for diabetes will be done along with other tests if problems develop.  Urine tests to check for  infections, diabetes, or protein in the urine.  An ultrasound to confirm the proper growth and development of the baby.  An amniocentesis to check for possible genetic problems.  Fetal screens for spina bifida and Down syndrome.  You may need other tests to make sure you and the baby are doing well.  HIV (human immunodeficiency virus) testing. Routine prenatal testing includes screening for HIV, unless you choose not to have this test. HOME CARE INSTRUCTIONS  Medicines  Follow your health care provider's instructions regarding medicine use. Specific medicines may be either safe or unsafe to take during pregnancy.  Take your prenatal vitamins as directed.  If you develop constipation, try taking a stool softener if your health care provider approves. Diet  Eat regular, well-balanced meals. Choose a variety of foods, such as meat or vegetable-based protein, fish, milk and low-fat dairy products, vegetables, fruits, and whole grain breads and cereals. Your health care provider will help you determine the amount of weight gain that is right for you.  Avoid raw meat and uncooked cheese. These carry germs that can cause birth defects in the baby.  Eating four or five small meals rather than three large meals a day may help relieve nausea and vomiting. If you start to feel nauseous, eating a few soda crackers can be helpful. Drinking liquids between meals instead of during meals also seems to help nausea and vomiting.  If you develop constipation, eat more high-fiber foods, such as fresh vegetables or fruit and whole grains. Drink enough fluids to keep your urine clear or pale yellow. Activity and Exercise  Exercise only as directed by your health care provider. Exercising will help you:  Control your weight.  Stay in shape.  Be prepared for labor and delivery.  Experiencing pain or cramping in the lower abdomen or low back is a good sign that you should stop exercising. Check with  your health care provider before continuing normal exercises.  Try to avoid standing for long periods of time. Move your legs often if you must stand in one place for a long time.  Avoid heavy lifting.  Wear low-heeled shoes, and practice good posture.  You may continue to have sex unless your health care provider directs you otherwise. Relief of Pain or Discomfort  Wear a good support bra for breast tenderness.   Take warm sitz baths to soothe any pain or discomfort caused by hemorrhoids. Use hemorrhoid cream if your health care provider approves.   Rest with your legs elevated if you have leg cramps or low back pain.  If you develop varicose veins in your legs, wear support hose. Elevate your feet for 15 minutes, 3-4 times a day. Limit salt in your diet. Prenatal Care  Schedule your prenatal visits by the twelfth week of pregnancy. They are usually scheduled monthly at first, then more often in the last 2 months before delivery.  Write  down your questions. Take them to your prenatal visits.  Keep all your prenatal visits as directed by your health care provider. Safety  Wear your seat belt at all times when driving.  Make a list of emergency phone numbers, including numbers for family, friends, the hospital, and police and fire departments. General Tips  Ask your health care provider for a referral to a local prenatal education class. Begin classes no later than at the beginning of month 6 of your pregnancy.  Ask for help if you have counseling or nutritional needs during pregnancy. Your health care provider can offer advice or refer you to specialists for help with various needs.  Do not use hot tubs, steam rooms, or saunas.  Do not douche or use tampons or scented sanitary pads.  Do not cross your legs for long periods of time.  Avoid cat litter boxes and soil used by cats. These carry germs that can cause birth defects in the baby and possibly loss of the fetus by  miscarriage or stillbirth.  Avoid all smoking, herbs, alcohol, and medicines not prescribed by your health care provider. Chemicals in these affect the formation and growth of the baby.  Do not use any tobacco products, including cigarettes, chewing tobacco, and electronic cigarettes. If you need help quitting, ask your health care provider. You may receive counseling support and other resources to help you quit.  Schedule a dentist appointment. At home, brush your teeth with a soft toothbrush and be gentle when you floss. SEEK MEDICAL CARE IF:   You have dizziness.  You have mild pelvic cramps, pelvic pressure, or nagging pain in the abdominal area.  You have persistent nausea, vomiting, or diarrhea.  You have a bad smelling vaginal discharge.  You have pain with urination.  You notice increased swelling in your face, hands, legs, or ankles. SEEK IMMEDIATE MEDICAL CARE IF:   You have a fever.  You are leaking fluid from your vagina.  You have spotting or bleeding from your vagina.  You have severe abdominal cramping or pain.  You have rapid weight gain or loss.  You vomit blood or material that looks like coffee grounds.  You are exposed to [place] measles and have never had them.  You are exposed to fifth disease or chickenpox.  You develop a severe headache.  You have shortness of breath.  You have any kind of trauma, such as from a fall or a car accident.   This information is not intended to replace advice given to you by your health care provider. Make sure you discuss any questions you have with your health care provider.   Document Released: 11/23/2001 Document Revised: 12/20/2014 Document Reviewed: 10/09/2013 Elsevier Interactive Patient Education [employer]    Gonorrhea Gonorrhea is an infection that can cause serious problems. If left untreated, the infection may:   Damage the female or female organs.   Cause women to be unable to have  children (sterility).   Harm a fetus if the infected woman is pregnant.  It is important to get treatment for gonorrhea as soon as possible. It is also necessary that all your sexual partners be tested for the infection.  CAUSES  Gonorrhea is caused by bacteria called Neisseria gonorrhoeae. The infection is spread from person to person, usually by sexual contact (such as by anal, vaginal, or oral means). A newborn can contract the infection from his or her mother during birth.  RISK FACTORS  Being a woman younger  than 24 years of age who is sexually active.  Being a woman 24 years of age or older who has:  A new sex partner.  More than one sex partner.  A sex partner who has a sexually transmitted disease (STD).  Using condoms inconsistently.  Currently having, or having previously had, an STD.  Exchanging sex or money or drugs. SYMPTOMS  Some people with gonorrhea do not have symptoms. Symptoms may be different in females and males.  Females The most common symptoms are:   Pain in the lower abdomen.   Fever with or without chills.  Other symptoms include:   Abnormal vaginal discharge.   Painful intercourse.   Burning or itching of the vagina or lips of the vagina.   Abnormal vaginal bleeding.   Pain when urinating.   Long-lasting (chronic) pain in the lower abdomen, especially during menstruation or intercourse.   Inability to become pregnant.   Going into premature labor.   Irritation, pain, bleeding, or discharge from the rectum. This may occur if the infection was spread by anal sex.   Sore throat or swollen lymph nodes in the neck. This may occur if the infection was spread by oral sex.  Males The most common symptoms are:   Discharge from the penis.   Pain or burning during urination.   Pain or swelling in the testicles. Other symptoms may include:   Irritation, pain, bleeding, or discharge from the rectum. This may occur if the  infection was spread by anal sex.   Sore throat, fever, or swollen lymph nodes in the neck. This may occur if the infection was spread by oral sex.  DIAGNOSIS  A diagnosis is made after a physical exam is done and a sample of discharge is examined under a microscope for the presence of the bacteria. The discharge may be taken from the urethra, cervix, throat, or rectum.  TREATMENT  Gonorrhea is treated with antibiotic medicines. It is important for treatment to begin as soon as possible. Early treatment may prevent some problems from developing. Do not have sex. Avoid all types of sexual activity for 7 days after treatment is complete and until any sex partners have been treated. HOME CARE INSTRUCTIONS   Take medicines only as directed by your health care provider.   Take your antibiotic medicine as directed by your health care provider. Finish the antibiotic even if you start to feel better. Incomplete treatment will put you at risk for continued infection.   Do not have sex until treatment is complete or as directed by your health care provider.   Keep all follow-up visits as directed by your health care provider.   Not all test results are available during your visit. If your test results are not back during the visit, make an appointment with your health care provider to find out the results. Do not assume everything is normal if you have not heard from your health care provider or the medical facility. It is your responsibility to get your test results.  If you test positive for gonorrhea, inform your recent sexual partners. They need to be checked for gonorrhea even if they do not have symptoms. They may need treatment, even if they test negative for gonorrhea.  SEEK MEDICAL CARE IF:   You develop any bad reaction to the medicine you were prescribed. This may include:   A rash.   Nausea.   Vomiting.   Diarrhea.   Your symptoms do not improve after a  few days of  taking antibiotics.   Your symptoms get worse.   You develop increased pain, such as in the testicles (for males) or in the abdomen (for females).  You have a fever. MAKE SURE YOU:   Understand these instructions.  Will watch your condition.  Will get help right away if you are not doing well or get worse.   This information is not intended to replace advice given to you by your health care provider. Make sure you discuss any questions you have with your health care provider.   Document Released: 11/26/2000 Document Revised: 12/20/2014 Document Reviewed: 06/06/2013 Elsevier Interactive Patient Education 2016 [employer].     Safe Medications in Pregnancy   Acne: Benzoyl Peroxide Salicylic Acid  Backache/Headache: Tylenol: 2 regular strength every 4 hours OR              2 Extra strength every 6 hours  Colds/Coughs/Allergies: Benadryl (alcohol free) 25 mg every 6 hours as needed Breath right strips Claritin Cepacol throat lozenges Chloraseptic throat spray Cold-Eeze- up to three times per day Cough drops, alcohol free Flonase (by prescription only) Guaifenesin Mucinex Robitussin DM (plain only, alcohol free) Saline nasal spray/drops Sudafed (pseudoephedrine) & Actifed ** use only after [redacted] weeks gestation and if you do not have high blood pressure Tylenol Vicks Vaporub Zinc lozenges Zyrtec   Constipation: Colace Ducolax suppositories Fleet enema Glycerin suppositories Metamucil Milk of magnesia Miralax Senokot Smooth move tea  Diarrhea: Kaopectate Imodium A-D  *NO pepto Bismol  Hemorrhoids: Anusol Anusol HC Preparation H Tucks  Indigestion: Tums Maalox Mylanta Zantac  Pepcid  Insomnia: Benadryl (alcohol free) 25mg  every 6 hours as needed Tylenol PM Unisom, no Gelcaps  Leg Cramps: Tums MagGel  Nausea/Vomiting:  Bonine Dramamine Emetrol Ginger extract Sea bands Meclizine  Nausea medication to take during pregnancy:    Unisom (doxylamine succinate 25 mg tablets) Take one tablet daily at bedtime. If symptoms are not adequately controlled, the dose can be increased to a maximum recommended dose of two tablets daily (1/2 tablet in the morning, 1/2 tablet mid-afternoon and one at bedtime). Vitamin B6 100mg  tablets. Take one tablet twice a day (up to 200 mg per day).  Skin Rashes: Aveeno products Benadryl cream or 25mg  every 6 hours as needed Calamine Lotion 1% cortisone cream  Yeast infection: Gyne-lotrimin 7 Monistat 7   **If taking multiple medications, please check labels to avoid duplicating the same active ingredients **take medication as directed on the label ** Do not exceed 4000 mg of tylenol in 24 hours **Do not take medications that contain aspirin or ibuprofen

## 2015-11-19 NOTE — [facility]U Provider Note (Signed)
History     CSN: [id number]  Arrival date and time: 11/19/15 1546   First Provider Initiated Contact with Patient 11/19/15 1652      Chief Complaint  Patient presents with  . Abdominal Pain   HPI  Audrey Collins is a 24 y.o. G2P1001 at [id number] who presents with abdominal cramping. Reports intermittent lower abdominal cramping that occurs daily. Denies pain at this time. No treatment.  Had some red spotting once over the weekend after having intercourse; denies bleeding since then.  Vaginal discharge x 1 month. Yellow/brown in color and has a foul odor. Denies vaginal irritation.  Has been diagnosed with gonorrhea twice. Last diagnosed last month in [facility]U & treated at that time. Patient reports having intercourse with same partner last week & that he has not been treated.  Reports daily nausea, denies vomiting.  Denies diarrhea or constipation.  Denies fever.    OB History    Gravida Para Term Preterm AB TAB SAB Ectopic Multiple Living   2 1 1       1       Past Medical History  Diagnosis Date  . Placenta previa marginalis     resolved per pt  . Anxiety   . Short cervix affecting pregnancy     took prometrium, resolved  . Chlamydia infection complicating pregnancy   . Obesity   . Bipolar 1 disorder (HCC)   . Pregnancy induced hypertension   . Gonorrhea     Past Surgical History  Procedure Laterality Date  . No past surgeries      Family History  Problem Relation Age of Onset  . Hypertension Father   . Thyroid disease Mother   . Diabetes Paternal Grandfather   . Diabetes Maternal Grandfather     Social History  Substance Use Topics  . Smoking status: Former Smoker -- 0.25 packs/day for .5 years    Quit date: 10/13/2012  . Smokeless tobacco: Never Used  . Alcohol Use: No     Comment: none since finding out pregnant    Allergies: No Known Allergies  Prescriptions prior to admission  Medication Sig Dispense Refill Last Dose  . citalopram (CELEXA) 20 MG  tablet Take 1 tablet (20 mg total) by mouth daily. (Patient not taking: Reported on 11/19/2015) 30 tablet 1   . metroNIDAZOLE (FLAGYL) 500 MG tablet Take 1 tablet (500 mg total) by mouth 2 (two) times daily. One tab PO bid x 10 days (Patient not taking: Reported on 11/19/2015) 20 tablet 0   . promethazine-dextromethorphan (PROMETHAZINE-DM) 6.25-15 MG/5ML syrup Take 5 mLs by mouth 4 (four) times daily as needed for cough. (Patient not taking: Reported on 11/19/2015) 118 mL 0     Review of Systems  Constitutional: Negative.   Gastrointestinal: Positive for nausea and abdominal pain. Negative for vomiting, diarrhea and constipation.  Genitourinary: Negative for dysuria.       + vaginal bleeding over the weekend + vaginal discharge   Physical Exam   Blood pressure 115/79, pulse 75, temperature 97.5 F (36.4 C), temperature source Oral, resp. rate 18, height 5\' 8"  (1.727 m), weight 204 lb 9.6 oz (92.806 kg), last menstrual period 09/20/2015.  Physical Exam  Nursing note and vitals reviewed. Constitutional: She is oriented to person, place, and time. She appears well-developed and well-nourished. No distress.  HENT:  Head: Normocephalic and atraumatic.  Eyes: Conjunctivae are normal. Right eye exhibits no discharge. Left eye exhibits no discharge. No scleral icterus.  Neck: Normal range of  motion.  Cardiovascular: Normal rate, regular rhythm and normal heart sounds.   No murmur heard. Respiratory: Effort normal and breath sounds normal. No respiratory distress. She has no wheezes.  GI: Soft. She exhibits no distension. There is no tenderness. There is no rebound.  Genitourinary: Vagina normal. Cervix exhibits discharge (small amount of yellow mucoid discharge at cervical os). Cervix exhibits no motion tenderness and no friability.  Cervix closed  Neurological: She is alert and oriented to person, place, and time.  Skin: Skin is warm and dry. She is not diaphoretic.  Psychiatric: She has a  normal mood and affect. Her behavior is normal. Judgment and thought content normal.    MAU Course  Procedures Results for orders placed or performed during the hospital encounter of 11/19/15 (from the past 24 hour(s))  CBC     Status: None   Collection Time: 11/19/15  4:15 PM  Result Value Ref Range   WBC 8.8 4.0 - 10.5 K/uL   RBC 4.59 3.87 - 5.11 MIL/uL   Hemoglobin 14.4 12.0 - 15.0 g/dL   HCT 16.1 09.6 - 04.5 %   MCV 93.5 78.0 - 100.0 fL   MCH 31.4 26.0 - 34.0 pg   MCHC 33.6 30.0 - 36.0 g/dL   RDW 40.9 81.1 - 91.4 %   Platelets 208 150 - 400 K/uL  hCG, quantitative, pregnancy     Status: Abnormal   Collection Time: 11/19/15  4:15 PM  Result Value Ref Range   hCG, Beta Chain, Quant, S 42040 (H) <5 mIU/mL  Wet prep, genital     Status: Abnormal   Collection Time: 11/19/15  6:00 PM  Result Value Ref Range   Yeast Wet Prep HPF POC NONE SEEN NONE SEEN   Trich, Wet Prep NONE SEEN NONE SEEN   Clue Cells Wet Prep HPF POC PRESENT (A) NONE SEEN   WBC, Wet Prep HPF POC MANY (A) NONE SEEN   Sperm NONE SEEN     US Ob Transvaginal  11/19/2015  CLINICAL DATA:  Subsequent encounter for pregnancy unknown location EXAM: TRANSVAGINAL OB ULTRASOUND TECHNIQUE: Transvaginal ultrasound was performed for complete evaluation of the gestation as well as the maternal uterus, adnexal regions, and pelvic cul-de-sac. COMPARISON:  10/27/2015. FINDINGS: Intrauterine gestational sac: Visualized/normal in shape. Yolk sac:  Visualized. Embryo:  Visualized. Cardiac Activity: Visualized. Heart Rate: 150 bpm CRL:   11.2  mm   7 w 1 d                  [place] EDC: 07/06/2016 Maternal uterus/adnexae: No evidence for subchorionic hemorrhage. Probable hemorrhagic corpus luteum cyst in the left ovary. Maternal right ovary is unremarkable. No free fluid in the cul-de-sac. IMPRESSION: Single living intrauterine gestation at estimated 7 week 1 day gestational age. Electronically Signed   By: Kennith Center M.D.   On: 11/19/2015  17:51    MDM O positive Was treated for gonorrhea during last MAU visit. Has had intercourse with same partner; he has not been treated. Will retreat today with ceftriaxone & azithromycin Partner in room with patient - states that the pharmacy was out of the medication & he's waiting for them to replenish their stock. Informed patient if he has trouble getting the medication filled then he can go to health department for treatment.  Ultrasound shows IUP with cardiac activity  Assessment and Plan  A: 1. Abdominal pain in pregnancy   2. STD exposure   3. Normal IUP (intrauterine pregnancy) on prenatal ultrasound,  first trimester    P: Discharge home Pregnancy verification letter & provider list given No intercourse for 7 days after BOTH pt & partner have been treated Partner already has rx so will not provide with expedited partner tx today - did stress importance of tx & that he can go to PCP or HD for tx if the pharmacy is out of the medication Discussed reasons to return to MAU GC/CT & HIV pending  Judeth Horn, NP  11/19/2015, 4:52 PM

## 2015-11-20 LAB — GC/CHLAMYDIA PROBE AMP (~~LOC~~) NOT AT ARMC
CHLAMYDIA, DNA PROBE: NEGATIVE
NEISSERIA GONORRHEA: NEGATIVE

## 2016-03-19 ENCOUNTER — Emergency Department (HOSPITAL_COMMUNITY)
Admission: EM | Admit: 2016-03-19 | Discharge: 2016-03-19 | Disposition: A | Discharge status: Home / Self Care | Payer: Medicaid Other | Attending: Emergency Medicine | Admitting: Emergency Medicine

## 2016-03-19 ENCOUNTER — Encounter (HOSPITAL_COMMUNITY): Payer: Self-pay | Admitting: Emergency Medicine

## 2016-03-19 DIAGNOSIS — O99212 Obesity complicating pregnancy, second trimester: Secondary | ICD-10-CM | POA: Diagnosis not present

## 2016-03-19 DIAGNOSIS — Z8619 Personal history of other infectious and parasitic diseases: Secondary | ICD-10-CM | POA: Diagnosis not present

## 2016-03-19 DIAGNOSIS — Z8659 Personal history of other mental and behavioral disorders: Secondary | ICD-10-CM | POA: Insufficient documentation

## 2016-03-19 DIAGNOSIS — Z87891 Personal history of nicotine dependence: Secondary | ICD-10-CM | POA: Diagnosis not present

## 2016-03-19 DIAGNOSIS — Z3A25 25 weeks gestation of pregnancy: Secondary | ICD-10-CM | POA: Insufficient documentation

## 2016-03-19 DIAGNOSIS — R109 Unspecified abdominal pain: Secondary | ICD-10-CM | POA: Insufficient documentation

## 2016-03-19 DIAGNOSIS — O9989 Other specified diseases and conditions complicating pregnancy, childbirth and the puerperium: Secondary | ICD-10-CM | POA: Diagnosis present

## 2016-03-19 DIAGNOSIS — E669 Obesity, unspecified: Secondary | ICD-10-CM | POA: Diagnosis not present

## 2016-03-19 DIAGNOSIS — Z3490 Encounter for supervision of normal pregnancy, unspecified, unspecified trimester: Secondary | ICD-10-CM

## 2016-03-19 LAB — CBC WITH DIFFERENTIAL/PLATELET
BASOS ABS: 0 10*3/uL (ref 0.0–0.1)
BASOS PCT: 0 %
EOS PCT: 1 %
Eosinophils Absolute: 0.1 10*3/uL (ref 0.0–0.7)
HCT: 34.6 % — ABNORMAL LOW (ref 36.0–46.0)
Hemoglobin: 11.6 g/dL — ABNORMAL LOW (ref 12.0–15.0)
LYMPHS PCT: 10 %
Lymphs Abs: 1.3 10*3/uL (ref 0.7–4.0)
MCH: 31.8 pg (ref 26.0–34.0)
MCHC: 33.5 g/dL (ref 30.0–36.0)
MCV: 94.8 fL (ref 78.0–100.0)
MONO ABS: 0.8 10*3/uL (ref 0.1–1.0)
Monocytes Relative: 6 %
Neutro Abs: 10.9 10*3/uL — ABNORMAL HIGH (ref 1.7–7.7)
Neutrophils Relative %: 83 %
PLATELETS: 188 10*3/uL (ref 150–400)
RBC: 3.65 MIL/uL — ABNORMAL LOW (ref 3.87–5.11)
RDW: 13.2 % (ref 11.5–15.5)
WBC: 13.1 10*3/uL — ABNORMAL HIGH (ref 4.0–10.5)

## 2016-03-19 LAB — URINE MICROSCOPIC-ADD ON: RBC / HPF: NONE SEEN RBC/hpf (ref 0–5)

## 2016-03-19 LAB — COMPREHENSIVE METABOLIC PANEL
ALT: 12 U/L — AB (ref 14–54)
AST: 16 U/L (ref 15–41)
Albumin: 3.5 g/dL (ref 3.5–5.0)
Alkaline Phosphatase: 69 U/L (ref 38–126)
Anion gap: 9 (ref 5–15)
BUN: 7 mg/dL (ref 6–20)
CHLORIDE: 108 mmol/L (ref 101–111)
CO2: 23 mmol/L (ref 22–32)
CREATININE: 0.5 mg/dL (ref 0.44–1.00)
Calcium: 9 mg/dL (ref 8.9–10.3)
Glucose, Bld: 79 mg/dL (ref 65–99)
POTASSIUM: 3.6 mmol/L (ref 3.5–5.1)
SODIUM: 140 mmol/L (ref 135–145)
Total Bilirubin: 0.8 mg/dL (ref 0.3–1.2)
Total Protein: 7.2 g/dL (ref 6.5–8.1)

## 2016-03-19 LAB — URINALYSIS, ROUTINE W REFLEX MICROSCOPIC
GLUCOSE, UA: NEGATIVE mg/dL
HGB URINE DIPSTICK: NEGATIVE
Nitrite: NEGATIVE
PROTEIN: 30 mg/dL — AB
Specific Gravity, Urine: 1.026 (ref 1.005–1.030)
pH: 6 (ref 5.0–8.0)

## 2016-03-19 LAB — PROTEIN / CREATININE RATIO, URINE
CREATININE, URINE: 246.59 mg/dL
PROTEIN CREATININE RATIO: 0.15 mg/mg{creat} (ref 0.00–0.15)
TOTAL PROTEIN, URINE: 36 mg/dL

## 2016-03-19 LAB — RAPID URINE DRUG SCREEN, HOSP PERFORMED
Amphetamines: NOT DETECTED
BARBITURATES: NOT DETECTED
Benzodiazepines: NOT DETECTED
Cocaine: POSITIVE — AB
Opiates: NOT DETECTED
TETRAHYDROCANNABINOL: POSITIVE — AB

## 2016-03-19 LAB — SALICYLATE LEVEL: Salicylate Lvl: <4 mg/dL (ref 2.8–30.0)

## 2016-03-19 LAB — ACETAMINOPHEN LEVEL: Acetaminophen (Tylenol), Serum: <10 ug/mL — ABNORMAL LOW (ref 10–30)

## 2016-03-19 LAB — ETHANOL: Alcohol, Ethyl (B): <5 mg/dL (ref <5)
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
## 2016-03-19 NOTE — ED Notes (Signed)
UNABLE TO COLLECT LABS  AT THIS TIME 

## 2016-03-19 NOTE — ED Notes (Signed)
Rapid Response OB Nurse at bedside.  
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
## 2016-03-19 NOTE — ED Notes (Signed)
OB rapid response at bedside. SANE/Amy reports call [phone] when pt medically cleared.

## 2016-03-19 NOTE — Discharge Instructions (Signed)
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
## 2016-08-31 ENCOUNTER — Encounter (HOSPITAL_COMMUNITY): Payer: Self-pay

## 2016-11-14 IMAGING — US US OB TRANSVAGINAL
1 series · 15 of 28 positions shown · non-contrast
Comparison: None.

CLINICAL DATA: First trimester pregnancy with pelvic pain.
Gestational age by LMP 5 weeks 2 days.

EXAM:
OBSTETRIC <14 WK US AND TRANSVAGINAL OB US
TECHNIQUE: Both transabdominal and transvaginal ultrasound examinations were
performed for complete evaluation of the gestation as well as the
maternal uterus, adnexal regions, and pelvic cul-de-sac.
Transvaginal technique was performed to assess early pregnancy.

[Series 1: us ob transvaginal · 15 of 65 slices shown]
[im 1/65]
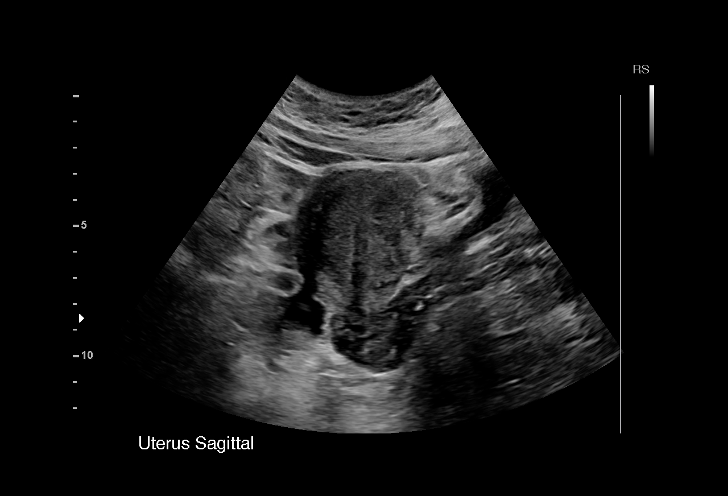
[im 5/65]
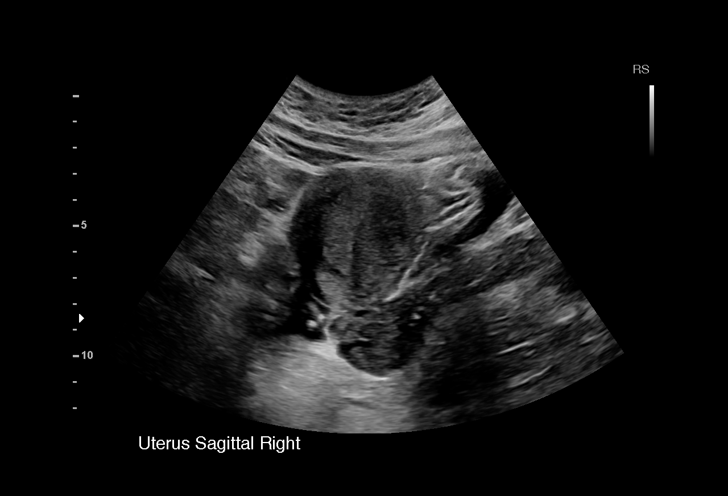
[im 10/65]
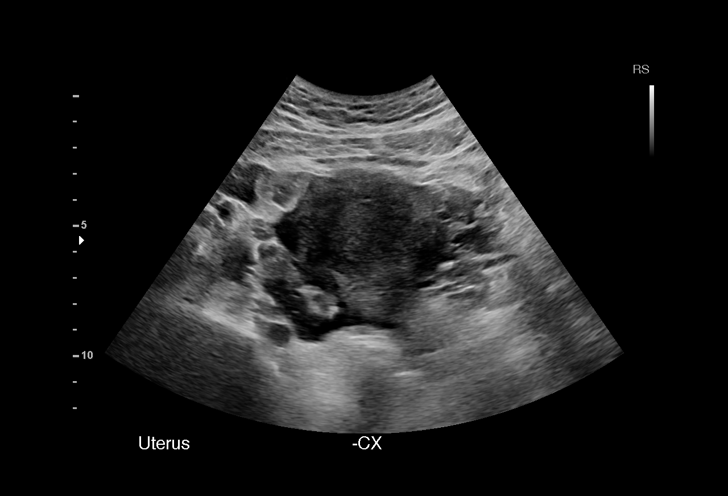
[im 15/65]
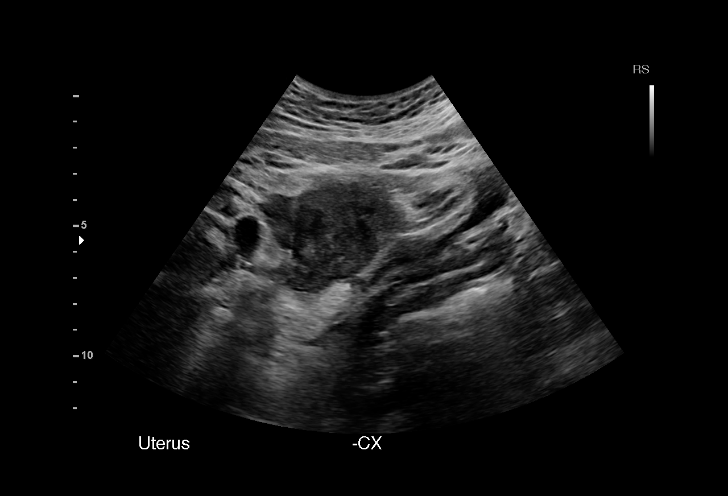
[im 19/65]
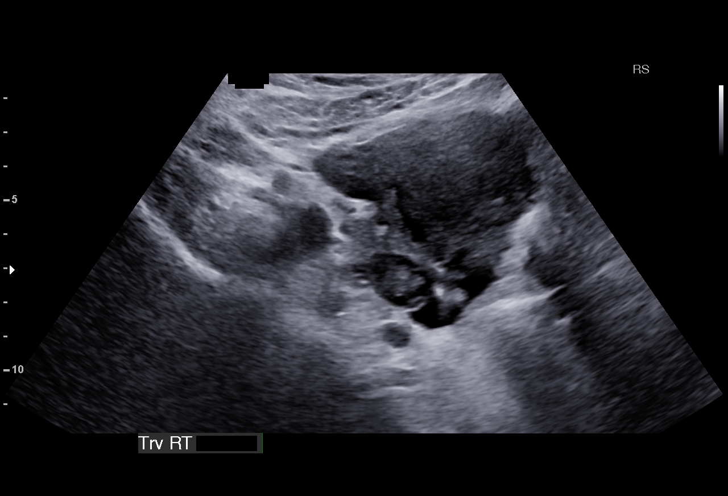
[im 24/65]
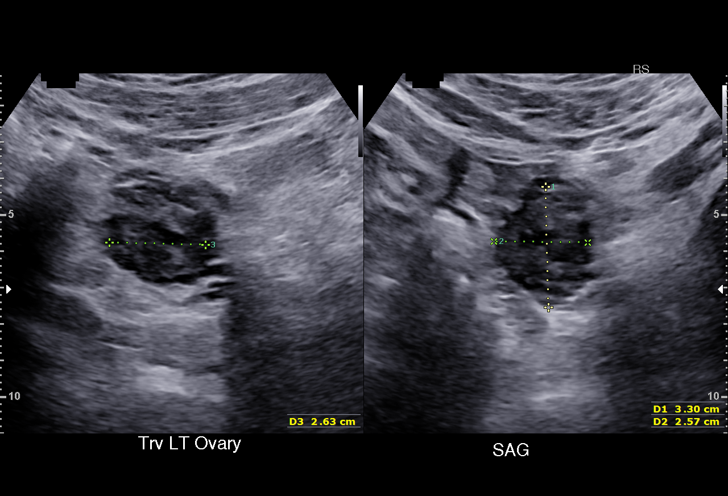
[im 29/65]
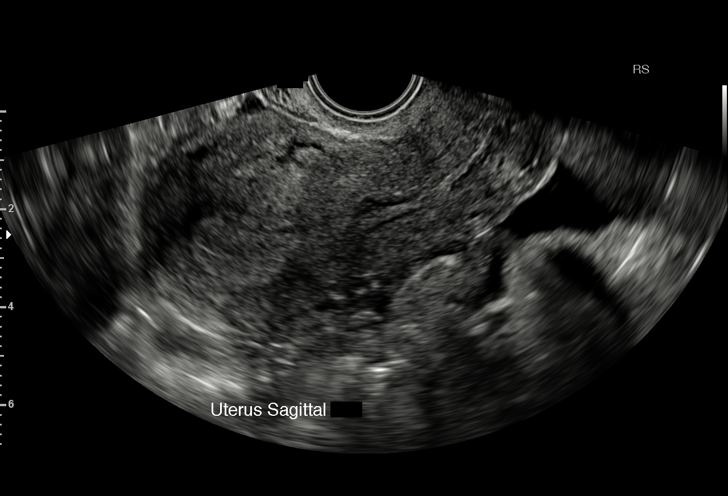
[im 34/65]
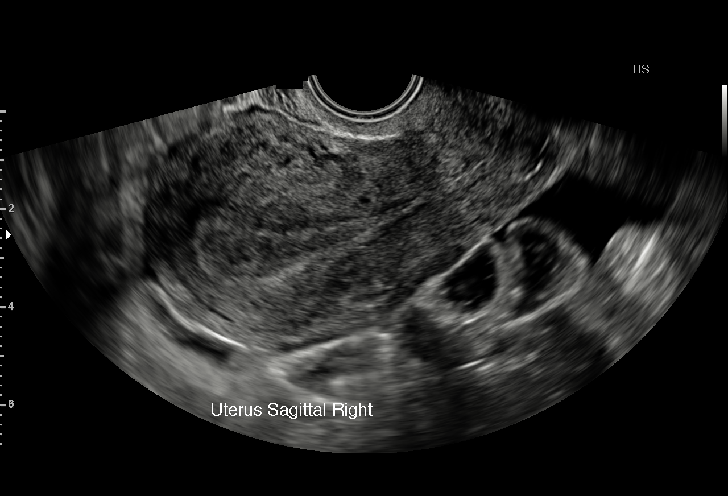
[im 36/65]
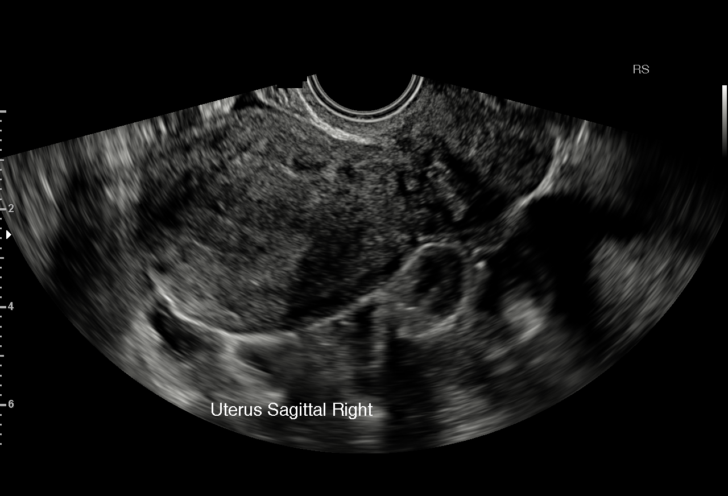
[im 41/65]
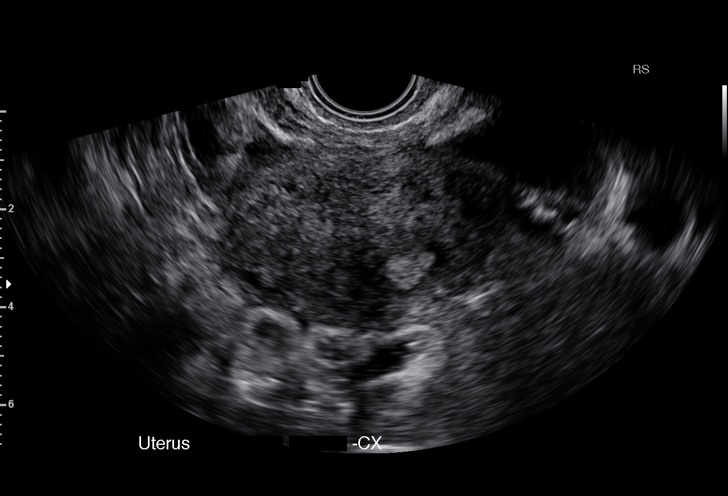
[im 46/65]
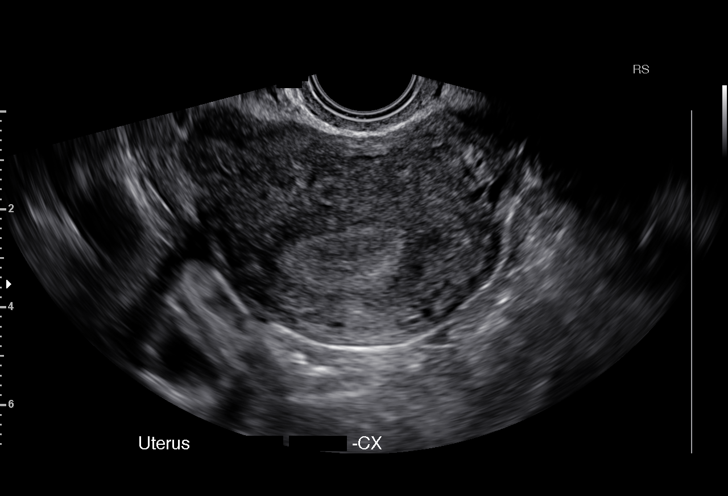
[im 50/65]
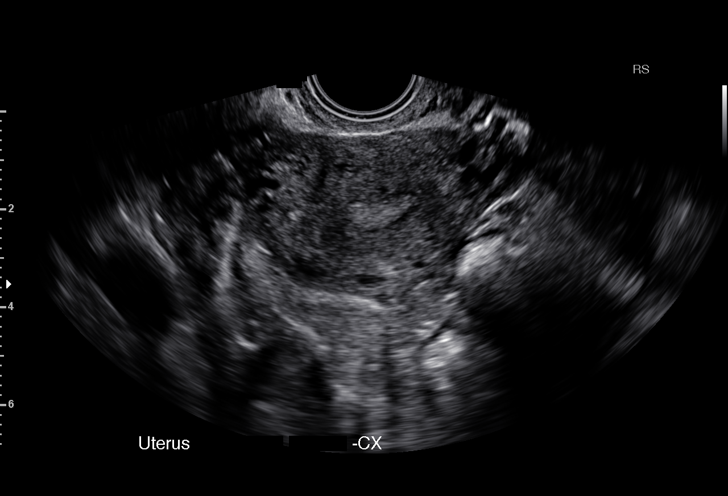
[im 55/65]
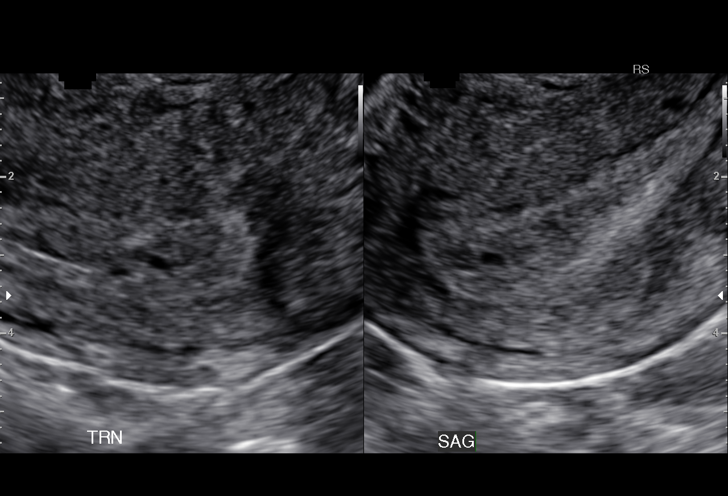
[im 60/65]
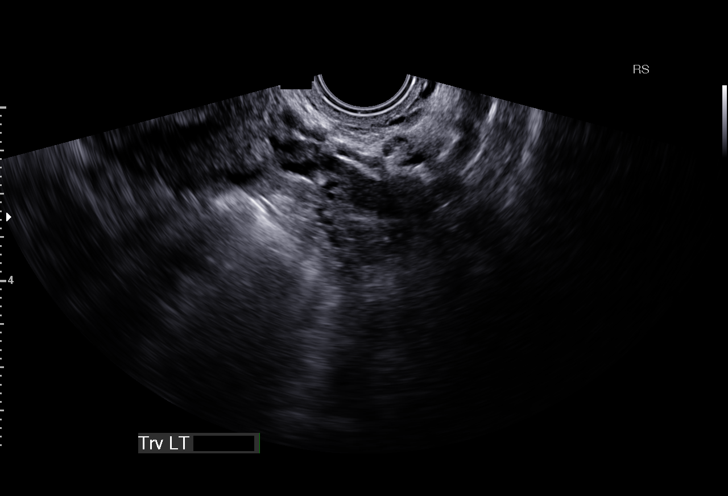
[im 65/65]
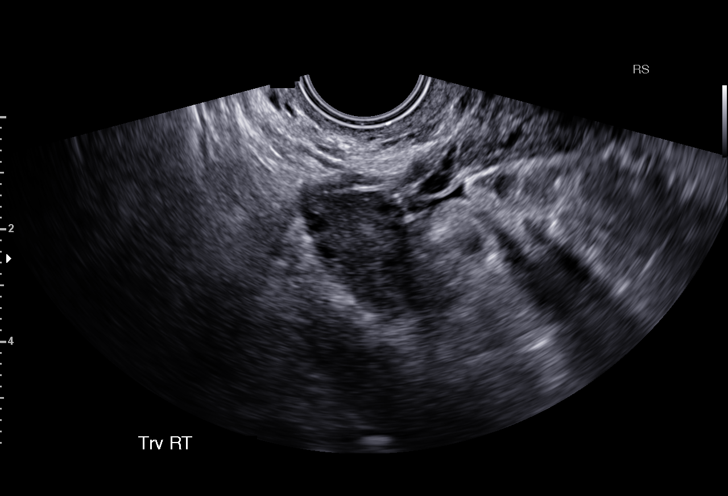

[15 of 28 positions shown; findings below may reference images not displayed]

FINDINGS: Intrauterine gestational sac: Questionable 3-4 mm gestational sac,
not definite.

Yolk sac:  Not visible

Embryo:  Not visible

MSD: 3.3  mm   5 w   0  d

Maternal uterus/adnexae: Both ovaries are normal. Suspected corpus
luteum on the left. No evidence of subchorionic hemorrhage. Trace
amount of free fluid, not pathologic.
IMPRESSION: Probable early intrauterine gestational sac, but no yolk sac, fetal
pole, or cardiac activity yet visualized. Recommend follow-up
quantitative B-HCG levels and follow-up US in 14 days to confirm and
assess viability. This recommendation follows SRU consensus
guidelines: Diagnostic Criteria for Nonviable Pregnancy Early in the
First Trimester. N Engl J Med 8252; [DATE].
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
## 2019-09-10 ENCOUNTER — Other Ambulatory Visit: Payer: Self-pay

## 2019-09-10 DIAGNOSIS — Z20822 Contact with and (suspected) exposure to covid-19: Secondary | ICD-10-CM

## 2019-09-12 LAB — NOVEL CORONAVIRUS, NAA: SARS-CoV-2, NAA: DETECTED — AB

## 2019-09-25 ENCOUNTER — Other Ambulatory Visit: Payer: Self-pay

## 2019-09-25 DIAGNOSIS — Z20822 Contact with and (suspected) exposure to covid-19: Secondary | ICD-10-CM

## 2019-09-27 LAB — NOVEL CORONAVIRUS, NAA: SARS-CoV-2, NAA: NOT DETECTED
